# Patient Record
Sex: Female | Born: 1955 | Race: White | Hispanic: No | Smoking: Former smoker
Health system: Southern US, Community
[De-identification: ages and names within clinical notes are randomized; demographics above are authoritative.]

## PROBLEM LIST (undated history)

## (undated) DIAGNOSIS — J449 Chronic obstructive pulmonary disease, unspecified: Secondary | ICD-10-CM

## (undated) DIAGNOSIS — K219 Gastro-esophageal reflux disease without esophagitis: Secondary | ICD-10-CM

## (undated) DIAGNOSIS — R1084 Generalized abdominal pain: Secondary | ICD-10-CM

## (undated) DIAGNOSIS — R1310 Dysphagia, unspecified: Secondary | ICD-10-CM

## (undated) DIAGNOSIS — M1990 Unspecified osteoarthritis, unspecified site: Secondary | ICD-10-CM

## (undated) DIAGNOSIS — R634 Abnormal weight loss: Secondary | ICD-10-CM

## (undated) DIAGNOSIS — G510 Bell's palsy: Secondary | ICD-10-CM

## (undated) DIAGNOSIS — R197 Diarrhea, unspecified: Secondary | ICD-10-CM

## (undated) DIAGNOSIS — I10 Essential (primary) hypertension: Secondary | ICD-10-CM

## (undated) DIAGNOSIS — B019 Varicella without complication: Secondary | ICD-10-CM

## (undated) HISTORY — PX: DIAGNOSTIC LAPAROSCOPY: SUR761

## (undated) HISTORY — PX: TEMPOROMANDIBULAR JOINT SURGERY: SHX35

## (undated) HISTORY — DX: Chronic obstructive pulmonary disease, unspecified: J44.9

---

## 2008-11-21 ENCOUNTER — Emergency Department: Payer: Self-pay | Admitting: Emergency Medicine

## 2012-04-09 ENCOUNTER — Emergency Department: Payer: Self-pay | Admitting: Emergency Medicine

## 2012-04-09 LAB — COMPREHENSIVE METABOLIC PANEL
Albumin: 4.9 g/dL (ref 3.4–5.0)
Alkaline Phosphatase: 82 U/L (ref 50–136)
Anion Gap: 9 (ref 7–16)
BUN: 7 mg/dL (ref 7–18)
Bilirubin,Total: 0.4 mg/dL (ref 0.2–1.0)
Calcium, Total: 9.5 mg/dL (ref 8.5–10.1)
Chloride: 103 mmol/L (ref 98–107)
Co2: 26 mmol/L (ref 21–32)
Creatinine: 0.65 mg/dL (ref 0.60–1.30)
EGFR (African American): >60
EGFR (Non-African Amer.): >60
Glucose: 132 mg/dL — ABNORMAL HIGH (ref 65–99)
Osmolality: 276 (ref 275–301)
Potassium: 3.7 mmol/L (ref 3.5–5.1)
SGOT(AST): 18 U/L (ref 15–37)
SGPT (ALT): 17 U/L
Sodium: 138 mmol/L (ref 136–145)
Total Protein: 8.2 g/dL (ref 6.4–8.2)

## 2012-04-09 LAB — URINALYSIS, COMPLETE
Bacteria: NONE SEEN
Bilirubin,UR: NEGATIVE
Blood: NEGATIVE
Glucose,UR: NEGATIVE mg/dL (ref 0–75)
Ketone: NEGATIVE
Leukocyte Esterase: NEGATIVE
Nitrite: NEGATIVE
Ph: 7 (ref 4.5–8.0)
Protein: NEGATIVE
RBC,UR: <1 /HPF (ref 0–5)
Specific Gravity: 1.019 (ref 1.003–1.030)
Squamous Epithelial: <1
WBC UR: <1 /HPF (ref 0–5)

## 2012-04-09 LAB — CBC
HCT: 44.9 % (ref 35.0–47.0)
HGB: 15.2 g/dL (ref 12.0–16.0)
MCH: 29.9 pg (ref 26.0–34.0)
MCHC: 33.8 g/dL (ref 32.0–36.0)
MCV: 88 fL (ref 80–100)
Platelet: 245 10*3/uL (ref 150–440)
RBC: 5.08 10*6/uL (ref 3.80–5.20)
RDW: 13.7 % (ref 11.5–14.5)
WBC: 11.5 10*3/uL — ABNORMAL HIGH (ref 3.6–11.0)

## 2012-04-09 LAB — TROPONIN I
Troponin-I: 0.03 ng/mL
Troponin-I: 0.02 ng/mL

## 2012-04-09 LAB — TSH: Thyroid Stimulating Horm: 0.665 u[IU]/mL

## 2012-05-05 ENCOUNTER — Emergency Department: Payer: Self-pay | Admitting: Unknown Physician Specialty

## 2012-07-05 ENCOUNTER — Ambulatory Visit: Payer: Self-pay

## 2012-11-29 ENCOUNTER — Ambulatory Visit: Payer: Self-pay | Admitting: Orthopedic Surgery

## 2015-01-09 ENCOUNTER — Emergency Department: Payer: Self-pay | Admitting: Emergency Medicine

## 2015-01-10 LAB — COMPREHENSIVE METABOLIC PANEL
Albumin: 3.6 g/dL (ref 3.4–5.0)
Alkaline Phosphatase: 79 U/L (ref 46–116)
Anion Gap: 6 — ABNORMAL LOW (ref 7–16)
BUN: 8 mg/dL (ref 7–18)
Bilirubin,Total: 0.3 mg/dL (ref 0.2–1.0)
Calcium, Total: 8.8 mg/dL (ref 8.5–10.1)
Chloride: 108 mmol/L — ABNORMAL HIGH (ref 98–107)
Co2: 28 mmol/L (ref 21–32)
Creatinine: 0.78 mg/dL (ref 0.60–1.30)
EGFR (African American): >60
EGFR (Non-African Amer.): >60
Glucose: 99 mg/dL (ref 65–99)
Osmolality: 281 (ref 275–301)
Potassium: 3.9 mmol/L (ref 3.5–5.1)
SGOT(AST): 22 U/L (ref 15–37)
SGPT (ALT): 19 U/L (ref 14–63)
Sodium: 142 mmol/L (ref 136–145)
Total Protein: 6.5 g/dL (ref 6.4–8.2)

## 2015-01-10 LAB — URINALYSIS, COMPLETE
Bilirubin,UR: NEGATIVE
Blood: NEGATIVE
Glucose,UR: NEGATIVE mg/dL (ref 0–75)
Ketone: NEGATIVE
Leukocyte Esterase: NEGATIVE
Nitrite: NEGATIVE
Ph: 5 (ref 4.5–8.0)
Protein: NEGATIVE
RBC,UR: 2 /HPF (ref 0–5)
Specific Gravity: 1.017 (ref 1.003–1.030)
Squamous Epithelial: 3
WBC UR: 1 /HPF (ref 0–5)

## 2015-01-10 LAB — CBC WITH DIFFERENTIAL/PLATELET
Basophil #: 0 10*3/uL (ref 0.0–0.1)
Basophil %: 0.3 %
Eosinophil #: 0.2 10*3/uL (ref 0.0–0.7)
Eosinophil %: 2.2 %
HCT: 38.8 % (ref 35.0–47.0)
HGB: 13.1 g/dL (ref 12.0–16.0)
Lymphocyte #: 1.4 10*3/uL (ref 1.0–3.6)
Lymphocyte %: 13.6 %
MCH: 29.2 pg (ref 26.0–34.0)
MCHC: 33.6 g/dL (ref 32.0–36.0)
MCV: 87 fL (ref 80–100)
Monocyte #: 0.7 x10 3/mm (ref 0.2–0.9)
Monocyte %: 7.1 %
Neutrophil #: 8 10*3/uL — ABNORMAL HIGH (ref 1.4–6.5)
Neutrophil %: 76.8 %
Platelet: 220 10*3/uL (ref 150–440)
RBC: 4.48 10*6/uL (ref 3.80–5.20)
RDW: 12.9 % (ref 11.5–14.5)
WBC: 10.4 10*3/uL (ref 3.6–11.0)

## 2015-01-10 LAB — LIPASE, BLOOD: Lipase: 43 U/L — ABNORMAL LOW (ref 73–393)

## 2015-01-10 LAB — TROPONIN I: Troponin-I: <0.02 ng/mL

## 2015-03-20 ENCOUNTER — Ambulatory Visit: Admit: 2015-03-20 | Disposition: A | Discharge status: Home / Self Care | Payer: Self-pay | Admitting: Family Medicine

## 2015-06-26 DIAGNOSIS — R634 Abnormal weight loss: Secondary | ICD-10-CM | POA: Insufficient documentation

## 2015-06-26 DIAGNOSIS — R1084 Generalized abdominal pain: Secondary | ICD-10-CM | POA: Insufficient documentation

## 2015-06-26 DIAGNOSIS — R12 Heartburn: Secondary | ICD-10-CM | POA: Insufficient documentation

## 2015-06-26 DIAGNOSIS — R1310 Dysphagia, unspecified: Secondary | ICD-10-CM | POA: Insufficient documentation

## 2015-07-19 ENCOUNTER — Other Ambulatory Visit
Admission: RE | Admit: 2015-07-19 | Discharge: 2015-07-19 | Disposition: A | Discharge status: Home / Self Care | Payer: PRIVATE HEALTH INSURANCE | Source: Other Acute Inpatient Hospital | Attending: Nurse Practitioner | Admitting: Nurse Practitioner

## 2015-07-19 DIAGNOSIS — R197 Diarrhea, unspecified: Secondary | ICD-10-CM | POA: Diagnosis present

## 2015-07-19 LAB — C DIFFICILE QUICK SCREEN W PCR REFLEX
C Diff antigen: POSITIVE — AB
C Diff interpretation: POSITIVE
C Diff toxin: POSITIVE — AB

## 2015-07-21 LAB — STOOL CULTURE

## 2015-07-26 LAB — O&P RESULT

## 2015-07-26 LAB — GIARDIA, EIA; OVA/PARASITE: Giardia Ag, Stl: NEGATIVE

## 2015-09-13 ENCOUNTER — Encounter: Payer: Self-pay | Admitting: *Deleted

## 2015-09-14 ENCOUNTER — Encounter: Payer: Self-pay | Admitting: Anesthesiology

## 2015-09-14 ENCOUNTER — Encounter
Admission: RE | Disposition: A | Discharge status: Home / Self Care | Payer: Self-pay | Source: Ambulatory Visit | Attending: Unknown Physician Specialty

## 2015-09-14 ENCOUNTER — Ambulatory Visit
Admission: RE | Admit: 2015-09-14 | Discharge: 2015-09-14 | Disposition: A | Discharge status: Home / Self Care | Payer: PRIVATE HEALTH INSURANCE | Source: Ambulatory Visit | Attending: Unknown Physician Specialty | Admitting: Unknown Physician Specialty

## 2015-09-14 ENCOUNTER — Ambulatory Visit: Payer: PRIVATE HEALTH INSURANCE | Admitting: Anesthesiology

## 2015-09-14 DIAGNOSIS — R1084 Generalized abdominal pain: Secondary | ICD-10-CM | POA: Insufficient documentation

## 2015-09-14 DIAGNOSIS — R197 Diarrhea, unspecified: Secondary | ICD-10-CM | POA: Diagnosis not present

## 2015-09-14 DIAGNOSIS — K640 First degree hemorrhoids: Secondary | ICD-10-CM | POA: Diagnosis not present

## 2015-09-14 DIAGNOSIS — Z79899 Other long term (current) drug therapy: Secondary | ICD-10-CM | POA: Insufficient documentation

## 2015-09-14 DIAGNOSIS — R1310 Dysphagia, unspecified: Secondary | ICD-10-CM | POA: Diagnosis present

## 2015-09-14 DIAGNOSIS — I10 Essential (primary) hypertension: Secondary | ICD-10-CM | POA: Diagnosis not present

## 2015-09-14 DIAGNOSIS — K219 Gastro-esophageal reflux disease without esophagitis: Secondary | ICD-10-CM | POA: Insufficient documentation

## 2015-09-14 DIAGNOSIS — K2970 Gastritis, unspecified, without bleeding: Secondary | ICD-10-CM | POA: Diagnosis not present

## 2015-09-14 DIAGNOSIS — M1990 Unspecified osteoarthritis, unspecified site: Secondary | ICD-10-CM | POA: Diagnosis not present

## 2015-09-14 DIAGNOSIS — K621 Rectal polyp: Secondary | ICD-10-CM | POA: Insufficient documentation

## 2015-09-14 DIAGNOSIS — R12 Heartburn: Secondary | ICD-10-CM | POA: Diagnosis not present

## 2015-09-14 HISTORY — DX: Dysphagia, unspecified: R13.10

## 2015-09-14 HISTORY — DX: Diarrhea, unspecified: R19.7

## 2015-09-14 HISTORY — DX: Generalized abdominal pain: R10.84

## 2015-09-14 HISTORY — DX: Abnormal weight loss: R63.4

## 2015-09-14 HISTORY — DX: Bell's palsy: G51.0

## 2015-09-14 HISTORY — DX: Unspecified osteoarthritis, unspecified site: M19.90

## 2015-09-14 HISTORY — DX: Varicella without complication: B01.9

## 2015-09-14 HISTORY — DX: Essential (primary) hypertension: I10

## 2015-09-14 HISTORY — DX: Gastro-esophageal reflux disease without esophagitis: K21.9

## 2015-09-14 HISTORY — PX: ESOPHAGOGASTRODUODENOSCOPY (EGD) WITH PROPOFOL: SHX5813

## 2015-09-14 HISTORY — PX: COLONOSCOPY WITH PROPOFOL: SHX5780

## 2015-09-14 HISTORY — PX: SAVORY DILATION: SHX5439

## 2015-09-14 SURGERY — ESOPHAGOGASTRODUODENOSCOPY (EGD) WITH PROPOFOL
Anesthesia: General

## 2015-09-14 MED ORDER — PHENYLEPHRINE HCL 10 MG/ML IJ SOLN
INTRAMUSCULAR | Status: DC | PRN
  Administered 2015-09-14 (×3): 100 ug via INTRAVENOUS

## 2015-09-14 MED ORDER — SODIUM CHLORIDE 0.9 % IV SOLN: INTRAVENOUS | Status: DC

## 2015-09-14 MED ORDER — IPRATROPIUM-ALBUTEROL 0.5-2.5 (3) MG/3ML IN SOLN
3.0000 mL | Freq: Once | RESPIRATORY_TRACT | Status: AC
  Administered 2015-09-14: 3 mL via RESPIRATORY_TRACT

## 2015-09-14 MED ORDER — SODIUM CHLORIDE 0.9 % IV SOLN
INTRAVENOUS | Status: DC
  Administered 2015-09-14: 1000 mL via INTRAVENOUS

## 2015-09-14 MED ORDER — EPHEDRINE SULFATE 50 MG/ML IJ SOLN
INTRAMUSCULAR | Status: DC | PRN
  Administered 2015-09-14 (×2): 10 mg via INTRAVENOUS

## 2015-09-14 MED ORDER — PROPOFOL 500 MG/50ML IV EMUL
INTRAVENOUS | Status: DC | PRN
  Administered 2015-09-14: 150 ug/kg/min via INTRAVENOUS

## 2015-09-14 MED ORDER — LIDOCAINE HCL (CARDIAC) 20 MG/ML IV SOLN
INTRAVENOUS | Status: DC | PRN
  Administered 2015-09-14: 80 mg via INTRAVENOUS

## 2015-09-14 MED ORDER — GLYCOPYRROLATE 0.2 MG/ML IJ SOLN
INTRAMUSCULAR | Status: DC | PRN
  Administered 2015-09-14: 0.2 mg via INTRAVENOUS

## 2015-09-14 MED ORDER — IPRATROPIUM-ALBUTEROL 0.5-2.5 (3) MG/3ML IN SOLN
RESPIRATORY_TRACT | Status: AC
  Administered 2015-09-14: 3 mL
  Filled 2015-09-14: qty 3

## 2015-09-14 MED ORDER — MIDAZOLAM HCL 2 MG/2ML IJ SOLN
INTRAMUSCULAR | Status: DC | PRN
  Administered 2015-09-14: 2 mg via INTRAVENOUS

## 2015-09-14 NOTE — H&P (Signed)
   Primary Care Physician:  No primary care provider on file. Primary Gastroenterologist:  Dr. Vira Agar  Pre-Procedure History & Physical: HPI:  Emily Figueroa is a 59 y.o. female is here for an endoscopy and colonoscopy.   Past Medical History  Diagnosis Date  . Hypertension   . Arthritis   . Bell's palsy   . Chicken pox   . Diarrhea   . Dysphagia   . GERD (gastroesophageal reflux disease)   . Abdominal pain, generalized   . Weight loss     Past Surgical History  Procedure Laterality Date  . Diagnostic laparoscopy    . Temporomandibular joint surgery      Prior to Admission medications   Medication Sig Start Date End Date Taking? Authorizing Provider  omeprazole (PRILOSEC) 20 MG capsule Take 20 mg by mouth daily.   Yes Historical Provider, MD    Allergies as of 06/26/2015  . (Not on File)    History reviewed. No pertinent family history.  Social History   Social History  . Marital Status: Married    Spouse Name: N/A  . Number of Children: N/A  . Years of Education: N/A   Occupational History  . Not on file.   Social History Main Topics  . Smoking status: Not on file  . Smokeless tobacco: Not on file  . Alcohol Use: Not on file  . Drug Use: Not on file  . Sexual Activity: Not on file   Other Topics Concern  . Not on file   Social History Narrative    Review of Systems: See HPI, otherwise negative ROS  Physical Exam: BP 109/76 mmHg  Pulse 110  Temp(Src) 96.9 F (36.1 C) (Tympanic)  Resp 17  Ht 5' (1.524 m)  Wt 56.246 kg (124 lb)  BMI 24.22 kg/m2  SpO2 100% General:   Alert,  pleasant and cooperative in NAD Head:  Normocephalic and atraumatic. Neck:  Supple; no masses or thyromegaly. Lungs:  Clear throughout to auscultation.    Heart:  Regular rate and rhythm. Abdomen:  Soft, nontender and nondistended. Normal bowel sounds, without guarding, and without rebound.   Neurologic:  Alert and  oriented x4;  grossly normal  neurologically.  Impression/Plan: Emily Figueroa is here for an endoscopy and colonoscopy to be performed for dysphagia, diarrhea heartburn  Risks, benefits, limitations, and alternatives regarding  endoscopy and colonoscopy have been reviewed with the patient.  Questions have been answered.  All parties agreeable.   Gaylyn Cheers, MD  09/14/2015, 9:00 AM

## 2015-09-14 NOTE — Op Note (Signed)
[facility] Gastroenterology Patient Name: Emily Figueroa Procedure Date: 09/14/2015 9:16 AM MRN: [id number] Account #: [id number] Date of Birth: 06/05/1956 Admit Type: Outpatient Age: 59 Room: [facility] ENDO ROOM 1 Gender: Female Note Status: Finalized Procedure:         Colonoscopy Indications:       Diarrhea Providers:         Manya Silvas, MD Medicines:         Propofol per Anesthesia Complications:     No immediate complications. Procedure:         Pre-Anesthesia Assessment:                    - After reviewing the risks and benefits, the patient was                     deemed in satisfactory condition to undergo the procedure.                    After obtaining informed consent, the colonoscope was                     passed under direct vision. Throughout the procedure, the                     patient's blood pressure, pulse, and oxygen saturations                     were monitored continuously. The Olympus PCF-H180AL                     colonoscope ( S#: [id number] ) was introduced through the                     anus and advanced to the the cecum, identified by                     appendiceal orifice and ileocecal valve. The colonoscopy                     was performed without difficulty. The patient tolerated                     the procedure well. The quality of the bowel preparation                     was excellent. Findings:      Four sessile polyps were found in the rectum. The polyps were diminutive       in size. These polyps were removed with a jumbo cold forceps. Resection       and retrieval were complete.      Internal hemorrhoids were found during endoscopy. The hemorrhoids were       small and Grade I (internal hemorrhoids that do not prolapse).      The exam was otherwise without abnormality. Impression:        - Four diminutive polyps in the rectum. Resected and                     retrieved.                    - Internal hemorrhoids.                 - The examination was otherwise normal. Recommendation:    -  Await pathology results. Manya Silvas, MD 09/14/2015 9:34:02 AM This report has been signed electronically. Number of Addenda: 0 Note Initiated On: 09/14/2015 9:16 AM Scope Withdrawal Time: 0 hours 5 minutes 52 seconds  Total Procedure Duration: 0 hours 10 minutes 21 seconds       [facility]

## 2015-09-14 NOTE — Op Note (Signed)
[facility] Gastroenterology Patient Name: Emily Figueroa Procedure Date: 09/14/2015 9:04 AM MRN: [id number] Account #: [id number] Date of Birth: 28-Mar-1956 Admit Type: Outpatient Age: 60 Room: [facility] ENDO ROOM 1 Gender: Female Note Status: Finalized Procedure:         Upper GI endoscopy Indications:       Dysphagia Providers:         Manya Silvas, MD Referring MD:      No Local Md, MD (Referring MD) Medicines:         Propofol per Anesthesia Complications:     No immediate complications. Procedure:         Pre-Anesthesia Assessment:                    - After reviewing the risks and benefits, the patient was                     deemed in satisfactory condition to undergo the procedure.                    After obtaining informed consent, the endoscope was passed                     under direct vision. Throughout the procedure, the                     patient's blood pressure, pulse, and oxygen saturations                     were monitored continuously. The Endoscope was introduced                     through the mouth, and advanced to the second part of                     duodenum. The upper GI endoscopy was accomplished without                     difficulty. The patient tolerated the procedure well. Findings:      The examined esophagus was normal. A guidewire was placed and the scope       was withdrawn. Dilation was performed with a Savary dilator with no       resistance at 17 mm. GEJ 39cm      Scattered and patchy moderate inflammation characterized by erythema and       granularity was found in the gastric body. Biopsies were taken with a       cold forceps for histology. Biopsies were taken with a cold forceps for       Helicobacter pylori testing.      The examined duodenum was normal. Impression:        - Normal esophagus. Dilated.                    - Gastritis. Biopsied.                    - Normal examined duodenum. Recommendation:    - Await  pathology results. Manya Silvas, MD 09/14/2015 9:16:31 AM This report has been signed electronically. Number of Addenda: 0 Note Initiated On: 09/14/2015 9:04 AM      [facility]

## 2015-09-14 NOTE — Anesthesia Postprocedure Evaluation (Signed)
  Anesthesia Post-op Note  Patient: Emily Figueroa  Procedure(s) Performed: Procedure(s): ESOPHAGOGASTRODUODENOSCOPY (EGD) WITH PROPOFOL (N/A) COLONOSCOPY WITH PROPOFOL (N/A) SAVORY DILATION (N/A)  Anesthesia type:General  Patient location: PACU  Post pain: Pain level controlled  Post assessment: Post-op Vital signs reviewed, Patient's Cardiovascular Status Stable, Respiratory Function Stable, Patent Airway and No signs of Nausea or vomiting  Post vital signs: Reviewed and stable  Last Vitals:  Filed Vitals:   09/14/15 1005  BP: 109/76  Pulse: 100  Temp:   Resp: 15    Level of consciousness: awake, alert  and patient cooperative  Complications: No apparent anesthesia complications

## 2015-09-14 NOTE — Transfer of Care (Signed)
Immediate Anesthesia Transfer of Care Note  Patient: Mackie Goon Tennyson  Procedure(s) Performed: Procedure(s): ESOPHAGOGASTRODUODENOSCOPY (EGD) WITH PROPOFOL (N/A) COLONOSCOPY WITH PROPOFOL (N/A) SAVORY DILATION (N/A)  Patient Location: PACU and Endoscopy Unit  Anesthesia Type:General  Level of Consciousness: sedated  Airway & Oxygen Therapy: Patient Spontanous Breathing and Patient connected to nasal cannula oxygen  Post-op Assessment: Report given to RN and Post -op Vital signs reviewed and stable  Post vital signs: Reviewed and stable  Last Vitals: 09:37 100 hr 100 % 18r 96/59 97.2 Filed Vitals:   09/14/15 0935  BP:   Pulse:   Temp: 36.2 C  Resp:     Complications: No apparent anesthesia complications

## 2015-09-14 NOTE — Anesthesia Preprocedure Evaluation (Addendum)
Anesthesia Evaluation  Patient identified by MRN, date of birth, ID band Patient awake    Reviewed: Allergy & Precautions, H&P , NPO status , Patient's Chart, lab work & pertinent test results  Airway Mallampati: II  TM Distance: >3 FB Neck ROM: limited    Dental  (+) Upper Dentures, Poor Dentition, Missing   Pulmonary neg shortness of breath, COPD,    Pulmonary exam normal breath sounds clear to auscultation       Cardiovascular Exercise Tolerance: Good hypertension, (-) Past MI Normal cardiovascular exam Rhythm:regular Rate:Normal     Neuro/Psych negative neurological ROS  negative psych ROS   GI/Hepatic Neg liver ROS, GERD  Controlled,  Endo/Other  negative endocrine ROS  Renal/GU negative Renal ROS  negative genitourinary   Musculoskeletal  (+) Arthritis ,   Abdominal   Peds  Hematology negative hematology ROS (+)   Anesthesia Other Findings Past Medical History:   Hypertension                                                 Arthritis                                                    Bell's palsy                                                 Chicken pox                                                  Diarrhea                                                     Dysphagia                                                    GERD (gastroesophageal reflux disease)                       Abdominal pain, generalized                                  Weight loss                                                  Reproductive/Obstetrics negative OB ROS  Anesthesia Physical Anesthesia Plan  ASA: III  Anesthesia Plan: General   Post-op Pain Management:    Induction:   Airway Management Planned:   Additional Equipment:   Intra-op Plan:   Post-operative Plan:   Informed Consent: I have reviewed the patients History and Physical, chart, labs and  discussed the procedure including the risks, benefits and alternatives for the proposed anesthesia with the patient or authorized representative who has indicated his/her understanding and acceptance.   Dental Advisory Given  Plan Discussed with: Anesthesiologist, CRNA and Surgeon  Anesthesia Plan Comments:         Anesthesia Quick Evaluation

## 2015-09-17 LAB — SURGICAL PATHOLOGY

## 2015-10-05 ENCOUNTER — Encounter: Payer: Self-pay | Admitting: Unknown Physician Specialty

## 2017-09-17 ENCOUNTER — Ambulatory Visit
Admission: RE | Admit: 2017-09-17 | Discharge: 2017-09-17 | Disposition: A | Discharge status: Home / Self Care | Payer: BLUE CROSS/BLUE SHIELD | Source: Ambulatory Visit | Attending: Family | Admitting: Family

## 2017-09-17 ENCOUNTER — Other Ambulatory Visit: Payer: Self-pay | Admitting: Family

## 2017-09-17 DIAGNOSIS — R0600 Dyspnea, unspecified: Secondary | ICD-10-CM | POA: Diagnosis not present

## 2017-09-17 DIAGNOSIS — F17200 Nicotine dependence, unspecified, uncomplicated: Secondary | ICD-10-CM | POA: Insufficient documentation

## 2017-09-17 DIAGNOSIS — R5383 Other fatigue: Secondary | ICD-10-CM | POA: Insufficient documentation

## 2017-11-10 ENCOUNTER — Telehealth: Payer: Self-pay

## 2017-11-10 NOTE — Telephone Encounter (Signed)
Pt was referred for pulmonology, not primary. Gave pt phone # to call to get an appointment.

## 2017-11-10 NOTE — Telephone Encounter (Signed)
Copied from [place]. Topic: Referral - Question >> Nov 10, 2017 12:53 PM Corie Chiquito, [place] wrote: Reason for CRM: Patient called and stated that she has a referral to this office from a Garen Grams. Could someone please give her a call back about this at [phone]

## 2017-11-23 ENCOUNTER — Institutional Professional Consult (permissible substitution): Payer: PRIVATE HEALTH INSURANCE | Admitting: Internal Medicine

## 2017-11-25 ENCOUNTER — Telehealth: Payer: Self-pay | Admitting: Internal Medicine

## 2017-11-25 NOTE — Telephone Encounter (Signed)
Second Attempt  lmov for patient to call back need to reschedule appointment from 11/23/17  Due to weather we were closed.  She needs appointment rescheduled with Dr Ashby Dawes  Will await patient call.

## 2017-12-02 ENCOUNTER — Encounter: Payer: Self-pay | Admitting: Pulmonary Disease

## 2017-12-02 NOTE — Telephone Encounter (Signed)
3rd attempt   lmov to r/s appt missed due to weather   Mailed letter

## 2017-12-02 NOTE — Telephone Encounter (Signed)
Routed letter to pcp

## 2017-12-31 NOTE — Progress Notes (Signed)
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 -Chest x-ray consistent with severe emphysema. -Questionable allergic reaction to Symbicort in the past, she is given a coupon for Spiriva Respimat, and asked to start this 2 puffs once daily.  We also discussed the importance of smoking cessation.  We will send for PFT.  Excessive daytime sleepiness. -Daytime sleepiness and snoring at night, symptoms and signs of obstructive sleep apnea. - We will send for sleep study.  Tobacco abuse. -Spent greater than 3 minutes in discussion.  Atypical chest pain. - Left-sided with sometimes goes to her left arm.  This occurs at various times including at rest, no relation to activity.  It was explained to her that this is unlikely to be related to COPD - I asked that she let her primary care doctor know about the symptoms that she has not in the past.  She was also instructed that if she develops chest pain again she should call 911 or go to the emergency room.   Date: 01/01/2018  MRN# [id number] Emily Figueroa August 18, 1956    Emily Figueroa is a 62 y.o. old female seen in consultation for chief complaint of:    Chief Complaint  Patient presents with  . Advice Only    Referred by L.Sharpe for COPD eval:  . Shortness of Breath    with and without exertion  . Chest Pain    feels like something sitting on her chest  . Cough    mostly non productive  . Wheezing    slightly.    HPI:  She has dyspnea, she has had recurrent episodes of pneumonia in the past. She smokes occasionally, about once per week.  She has been started on proair and symbicort. Her speech is pressured today, but she does not have conversational dyspnea.  She stopped taking it after she broke out in hives. She has been on it for less than a month, this coincided when she ran out of singulair, which she is still on.  She remains dyspneic, she has proair but uses  it once or twice per month.  She snores loudly.  She has a history of TMJ.  She has trouble making it around a supermarket, this has been happening for about 2 years.   Desat walk 01/01/18; on RA at rest her sat is 98% and HR 95. She walked 360 feet at brisk pace, moderate dyspena, Sat 95% and HR 101.   Imaging personally reviewed, chest x-ray 09/17/17, hyperinflation consistent with emphysema.  CT chest 04/09/12 moderate to severe changes of apical emphysema   PMHX:   Past Medical History:  Diagnosis Date  . Abdominal pain, generalized   . Arthritis   . Bell's palsy   . Chicken pox   . Diarrhea   . Dysphagia   . GERD (gastroesophageal reflux disease)   . Hypertension   . Weight loss    Surgical Hx:  Past Surgical History:  Procedure Laterality Date  . COLONOSCOPY WITH PROPOFOL N/A 09/14/2015   Procedure: COLONOSCOPY WITH PROPOFOL;  Surgeon: Manya Silvas, MD;  Location: [facility] ENDOSCOPY;  Service: Endoscopy;  Laterality: N/A;  . DIAGNOSTIC LAPAROSCOPY    . ESOPHAGOGASTRODUODENOSCOPY (EGD) WITH PROPOFOL N/A 09/14/2015   Procedure: ESOPHAGOGASTRODUODENOSCOPY (EGD) WITH PROPOFOL;  Surgeon: Manya Silvas, MD;  [facility] ENDOSCOPY;  Service: Endoscopy;  Laterality: N/A;  . SAVORY DILATION N/A 09/14/2015   Procedure: SAVORY DILATION;  Surgeon:  Manya Silvas, MD;  Location: [facility] ENDOSCOPY;  Service: Endoscopy;  Laterality: N/A;  . TEMPOROMANDIBULAR JOINT SURGERY     Family Hx:  Family History  Problem Relation Age of Onset  . Leukemia Mother   . Heart Problems Mother    Social Hx:   Social History   Tobacco Use  . Smoking status: Current Some Day Smoker    Years: 45.00    Types: Cigarettes  . Smokeless tobacco: Never Used  Substance Use Topics  . Alcohol use: No    Frequency: Never  . Drug use: No   Medication:    Current Outpatient Medications:  .  albuterol (PROVENTIL HFA;VENTOLIN HFA) 108 (90 Base) MCG/ACT inhaler, Inhale 2 puffs into the lungs every 6  (six) hours as needed for wheezing or shortness of breath., Disp: , Rfl:  .  LORazepam (ATIVAN) 1 MG tablet, Take 1 mg by mouth daily as needed., Disp: , Rfl:  .  montelukast (SINGULAIR) 10 MG tablet, Take 10 mg by mouth at bedtime., Disp: , Rfl:  .  omeprazole (PRILOSEC) 20 MG capsule, Take 20 mg by mouth daily., Disp: , Rfl:    Allergies:  Symbicort [budesonide-formoterol fumarate]  Review of Systems: Gen:  Denies  fever, sweats, chills HEENT: Denies blurred vision, double vision. bleeds, sore throat Cvc:  No dizziness, chest pain. Resp:   Denies cough or sputum production, shortness of breath Gi: Denies swallowing difficulty, stomach pain. Gu:  Denies bladder incontinence, burning urine Ext:   No Joint pain, stiffness. Skin: No skin rash,  hives  Endoc:  No polyuria, polydipsia. Psych: No depression, insomnia. Other:  All other systems were reviewed with the patient and were negative other that what is mentioned in the HPI.   Physical Examination:   VS: BP (!) 144/98 (BP Location: Left Arm, Cuff Size: Normal)   Pulse (!) 107   Resp 16   Ht 5' (1.524 m)   Wt 133 lb (60.3 kg)   SpO2 100%   BMI 25.97 kg/m   General Appearance: No distress  Neuro:without focal findings,  speech normal,  HEENT: PERRLA, EOM intact.  [place] 3.  Pulmonary: normal breath sounds, No wheezing. Decreased air entry bilat.  CardiovascularNormal S1,S2.  No m/r/g.   Abdomen: Benign, Soft, non-tender. Renal:  No costovertebral tenderness  GU:  No performed at this time. Endoc: No evident thyromegaly, no signs of acromegaly. Skin:   warm, no rashes, no ecchymosis  Extremities: normal, no cyanosis, clubbing.  Other findings:    LABORATORY PANEL:   CBC No results for input(s): WBC, HGB, HCT, PLT in the last 168 hours. ------------------------------------------------------------------------------------------------------------------  Chemistries  No results for input(s): NA, K, CL, CO2,  GLUCOSE, BUN, CREATININE, CALCIUM, MG, AST, ALT, ALKPHOS, BILITOT in the last 168 hours.  Invalid input(s): GFRCGP ------------------------------------------------------------------------------------------------------------------  Cardiac Enzymes No results for input(s): TROPONINI in the last 168 hours. ------------------------------------------------------------  RADIOLOGY:  No results found.     Thank  you for the consultation and for allowing [facility] to assist in the care of your patient. Our recommendations are noted above.  Please contact us if we can be of further service.   Marda Stalker, MD.  Board Certified in Internal Medicine, Pulmonary Medicine, [place], and Sleep Medicine.  [facility] Office Number: [phone]  Patricia Pesa, M.D.  Merton Border, M.D  01/01/2018

## 2018-01-01 ENCOUNTER — Encounter: Payer: Self-pay | Admitting: Internal Medicine

## 2018-01-01 ENCOUNTER — Ambulatory Visit: Payer: BLUE CROSS/BLUE SHIELD | Admitting: Internal Medicine

## 2018-01-01 VITALS — BP 144/98 | HR 107 | Resp 16 | Ht 60.0 in | Wt 133.0 lb

## 2018-01-01 DIAGNOSIS — F17210 Nicotine dependence, cigarettes, uncomplicated: Secondary | ICD-10-CM

## 2018-01-01 DIAGNOSIS — G4719 Other hypersomnia: Secondary | ICD-10-CM | POA: Diagnosis not present

## 2018-01-01 DIAGNOSIS — J449 Chronic obstructive pulmonary disease, unspecified: Secondary | ICD-10-CM

## 2018-01-01 DIAGNOSIS — M6530 Trigger finger, unspecified finger: Secondary | ICD-10-CM | POA: Insufficient documentation

## 2018-01-01 DIAGNOSIS — Z720 Tobacco use: Secondary | ICD-10-CM

## 2018-01-01 MED ORDER — TIOTROPIUM BROMIDE MONOHYDRATE 2.5 MCG/ACT IN AERS: 2.0000 [IU] | INHALATION_SPRAY | Freq: Every day | RESPIRATORY_TRACT | 5 refills | Status: DC

## 2018-01-01 NOTE — Patient Instructions (Addendum)
--  Quitting smoking is the most important thing that you can do for your health.  --Quitting smoking will have greater affect on your health than any medicine that we can give you.   IF you are having chest pain you should go to the ER or call 911.   Start spiriva respimat 2 puffs once daily.   Will send for sleep study.

## 2018-01-06 ENCOUNTER — Telehealth: Payer: Self-pay | Admitting: Internal Medicine

## 2018-01-06 NOTE — Telephone Encounter (Signed)
Patient says she has contacted [facility] and they have not received the prescription for [facility]  Please submit

## 2018-01-06 NOTE — Telephone Encounter (Signed)
Called and left rx verbal on prescriber line. Left call back # if any questions. It appears that rx was submitted to [facility] on 01/01/18. Not sure why they didn't receive.

## 2018-01-07 NOTE — Telephone Encounter (Signed)
Per [facility] the spiriva is too expensive and to see if there is an alternative like the handi inhaler instead

## 2018-01-08 MED ORDER — TIOTROPIUM BROMIDE MONOHYDRATE 18 MCG IN CAPS: 18.0000 ug | ORAL_CAPSULE | Freq: Every day | RESPIRATORY_TRACT | 5 refills | Status: DC

## 2018-01-08 NOTE — Telephone Encounter (Signed)
Did she try using the coupon we gave here for spiriva respimat? If that does not work, then can try spiriva handihaler one puff daily.

## 2018-01-08 NOTE — Telephone Encounter (Signed)
Spiriva handihaler sent.

## 2018-01-08 NOTE — Addendum Note (Signed)
Addended by: Stephanie Coup on: 01/08/2018 08:56 AM   Modules accepted: Orders

## 2018-01-15 ENCOUNTER — Telehealth: Payer: Self-pay | Admitting: Internal Medicine

## 2018-01-15 NOTE — Telephone Encounter (Signed)
Pt states she is broken out into hives and her nose is runnig, she thinks this is an allergic reaction to the Spiriva. Please call to discuss

## 2018-01-18 NOTE — Telephone Encounter (Signed)
Stop for one week, and see if it goes away. Restart in one week, if symptoms return then stop and let us know.

## 2018-01-18 NOTE — Telephone Encounter (Signed)
Patient aware.

## 2018-02-03 ENCOUNTER — Encounter: Payer: Self-pay | Admitting: Internal Medicine

## 2018-02-03 DIAGNOSIS — G4733 Obstructive sleep apnea (adult) (pediatric): Secondary | ICD-10-CM

## 2018-02-03 DIAGNOSIS — G4719 Other hypersomnia: Secondary | ICD-10-CM

## 2018-02-03 DIAGNOSIS — Z720 Tobacco use: Secondary | ICD-10-CM

## 2018-02-03 DIAGNOSIS — J449 Chronic obstructive pulmonary disease, unspecified: Secondary | ICD-10-CM

## 2018-02-03 DIAGNOSIS — J4489 Other specified chronic obstructive pulmonary disease: Secondary | ICD-10-CM

## 2018-02-12 DIAGNOSIS — G4733 Obstructive sleep apnea (adult) (pediatric): Secondary | ICD-10-CM | POA: Diagnosis not present

## 2018-02-16 ENCOUNTER — Telehealth: Payer: Self-pay | Admitting: *Deleted

## 2018-02-16 DIAGNOSIS — G4733 Obstructive sleep apnea (adult) (pediatric): Secondary | ICD-10-CM

## 2018-02-16 NOTE — Telephone Encounter (Signed)
LMOM for pt to call back for sleep study. Pt has moderate OSA with an AHI of 18.

## 2018-02-17 NOTE — Telephone Encounter (Signed)
Pt informed of results. CPAP ordered. Nothing further needed.

## 2018-02-17 NOTE — Telephone Encounter (Signed)
LMOM for daughter and pt to give [place] a call back for results.

## 2018-02-17 NOTE — Telephone Encounter (Signed)
Spoke with daughter and she states the pt will call back this afternoon.

## 2018-02-17 NOTE — Telephone Encounter (Signed)
Patient daughter returning call  °

## 2018-03-03 ENCOUNTER — Telehealth: Payer: Self-pay | Admitting: Internal Medicine

## 2018-03-03 NOTE — Telephone Encounter (Signed)
Melissa with [facility] calling patient declines CPAP set up due to her financial responsibility  FYI

## 2018-04-19 ENCOUNTER — Ambulatory Visit: Payer: BLUE CROSS/BLUE SHIELD | Admitting: Internal Medicine

## 2018-04-19 ENCOUNTER — Encounter: Payer: Self-pay | Admitting: Internal Medicine

## 2018-04-19 VITALS — BP 142/82 | HR 110 | Ht 60.0 in | Wt 134.0 lb

## 2018-04-19 DIAGNOSIS — J449 Chronic obstructive pulmonary disease, unspecified: Secondary | ICD-10-CM | POA: Diagnosis not present

## 2018-04-19 DIAGNOSIS — J4489 Other specified chronic obstructive pulmonary disease: Secondary | ICD-10-CM

## 2018-04-19 DIAGNOSIS — G4733 Obstructive sleep apnea (adult) (pediatric): Secondary | ICD-10-CM

## 2018-04-19 NOTE — Progress Notes (Signed)
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 -Chest x-ray consistent with severe emphysema. -Doing well with spiriva, to continue. Had possible allergic reaction to symbicort in the past.   Obstructive sleep apnea.  -HST 02/03/18; AHI of 18.  --Found CPAP too expensive and was not willing to start.   Tobacco abuse. -She has quit smoking and is congratulated.    Date: 04/19/2018  MRN# [id number] Emily Figueroa 14-Jun-1956    Emily Figueroa is a 62 y.o. old female seen in consultation for chief complaint of:    Chief Complaint  Patient presents with  . Follow-up    COPD: SOB w/activity: cough at times: doing better:    HPI:  The patient is a 62 year old female with COPD.  Last visit it was noted that she had continued symptoms of dyspnea on exertion.  She was started on Spiriva, she developed a rash on her nose, she was asked to stop it and then restart it and see if the rash went away and came back.  She was also sent for sleep study, and recommended smoking cessation.  She is back on spiriva and notices that it helps her breathing, she stopped smoking and feels that her breathing is better. She went through a sleep study which was showed OSA. She has more energy during the day than she did before.   She has a history of TMJ.  She has trouble making it around a supermarket, this has been happening for about 2 years.   Desat walk 01/01/18; on RA at rest her sat is 98% and HR 95. She walked 360 feet at brisk pace, moderate dyspena, Sat 95% and HR 101.  Imaging personally reviewed, chest x-ray 09/17/17, hyperinflation consistent with emphysema.  CT chest 04/09/12 moderate to severe changes of apical emphysema  Social Hx:   Social History   Tobacco Use  . Smoking status: Current Some Day Smoker    Years: 45.00    Types: Cigarettes  . Smokeless tobacco: Never Used  Substance Use Topics  . Alcohol use: No   Frequency: Never  . Drug use: No   Medication:    Current Outpatient Medications:  .  albuterol (PROVENTIL HFA;VENTOLIN HFA) 108 (90 Base) MCG/ACT inhaler, Inhale 2 puffs into the lungs every 6 (six) hours as needed for wheezing or shortness of breath., Disp: , Rfl:  .  LORazepam (ATIVAN) 1 MG tablet, Take 1 mg by mouth daily as needed., Disp: , Rfl:  .  montelukast (SINGULAIR) 10 MG tablet, Take 10 mg by mouth at bedtime., Disp: , Rfl:  .  omeprazole (PRILOSEC) 20 MG capsule, Take 20 mg by mouth daily., Disp: , Rfl:  .  tiotropium (SPIRIVA HANDIHALER) 18 MCG inhalation capsule, Place 1 capsule (18 mcg total) into inhaler and inhale daily., Disp: 30 capsule, Rfl: 5   Allergies:  Spiriva handihaler [tiotropium bromide monohydrate] and Symbicort [budesonide-formoterol fumarate]  Review of Systems: Gen:  Denies  fever, sweats, chills HEENT: Denies blurred vision, double vision. bleeds, sore throat Cvc:  No dizziness, chest pain. Resp:   Denies cough or sputum production, shortness of breath Gi: Denies swallowing difficulty, stomach pain. Gu:  Denies bladder incontinence, burning urine Ext:   No Joint pain, stiffness. Skin: No skin rash,  hives  Endoc:  No polyuria, polydipsia. Psych: No depression, insomnia. Other:  All other systems were reviewed with the patient and were negative other that what is mentioned  in the HPI.   Physical Examination:   VS: BP (!) 142/82 (BP Location: Left Arm, Cuff Size: Normal)   Pulse (!) 110   Ht 5' (1.524 m)   Wt 134 lb (60.8 kg)   SpO2 96%   BMI 26.17 kg/m   General Appearance: No distress  Neuro:without focal findings,  speech normal,  HEENT: PERRLA, EOM intact.  [place] 3.  Pulmonary: normal breath sounds, No wheezing. Decreased air entry bilat.  CardiovascularNormal S1,S2.  No m/r/g.   Abdomen: Benign, Soft, non-tender. Renal:  No costovertebral tenderness  GU:  No performed at this time. Endoc: No evident thyromegaly, no signs of  acromegaly. Skin:   warm, no rashes, no ecchymosis  Extremities: normal, no cyanosis, clubbing.  Other findings:    LABORATORY PANEL:   CBC No results for input(s): WBC, HGB, HCT, PLT in the last 168 hours. ------------------------------------------------------------------------------------------------------------------  Chemistries  No results for input(s): NA, K, CL, CO2, GLUCOSE, BUN, CREATININE, CALCIUM, MG, AST, ALT, ALKPHOS, BILITOT in the last 168 hours.  Invalid input(s): GFRCGP ------------------------------------------------------------------------------------------------------------------  Cardiac Enzymes No results for input(s): TROPONINI in the last 168 hours. ------------------------------------------------------------  RADIOLOGY:  No results found.     Thank  you for the consultation and for allowing [facility] to assist in the care of your patient. Our recommendations are noted above.  Please contact us if we can be of further service.   Marda Stalker, MD.  Board Certified in Internal Medicine, Pulmonary Medicine, [place], and Sleep Medicine.  [facility] Office Number: [phone]  Patricia Pesa, M.D.  Merton Border, M.D  04/19/2018

## 2018-04-19 NOTE — Patient Instructions (Signed)
Congratulations on quitting smoking, this was the best think that you could have done for your health.

## 2018-11-16 NOTE — Progress Notes (Signed)
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 -Chest x-ray consistent with severe emphysema. -Doing well with spiriva, to continue. Had possible allergic reaction to symbicort in the past.  --up to date on flu, s/p PPSV 10/2018.  --Will refer to lung cancer screening.  --PFT before next visit.   Obstructive sleep apnea. -HST 02/03/18; AHI of 18.  --Found CPAP too expensive and was not willing to start, declines referral to a dentist for a dental device.   Tobacco abuse. -Continue to work on smoking cessation. Discussed for 3 min. Is thinking of quitting on 12/31 but does not want assistance.   Orders Placed This Encounter  Procedures  . Pulmonary Function Test [facility] Only   Return in about 6 months (around 05/19/2019).   Date: 11/16/2018  MRN# [id number] Emily PUETT 10-24-1956    Emily Figueroa is a 62 y.o. old female seen in consultation for chief complaint of:    Chief Complaint  Patient presents with  . COPD    pt states breathing stable, she has cough early morning but afterwards she is better.  . Sleep Apnea    not on cpap    HPI:  The patient is a 62 year old female with history of COPD.  He has had a possible allergic reaction to Symbicort in the past, asked to maintain on Spiriva.  Patient also has a history of OSA but did not start CPAP due to cost.  She is using spiriva once daily. She is still smoking about 5 per day, and is trying to quit, and she thinks that Dec. 31st is her quit date. She has been on chantix twice in the past and feels that it helped but does not want to try it again. She has smoked 1-1.5 ppd, started smoking at age of 62.  She is not interested in a referral to a dentist for possible dental device.   She has a history of TMJ.    Desat walk 01/01/18; on RA at rest her sat is 98% and HR 95. She walked 360 feet at brisk pace, moderate dyspena, Sat 95% and HR 101.  Imaging  personally reviewed, chest x-ray 09/17/17, hyperinflation consistent with emphysema.  CT chest 04/09/12 moderate to severe changes of apical emphysema  Social Hx:   Social History   Tobacco Use  . Smoking status: Former Smoker    Years: 45.00    Types: Cigarettes  . Smokeless tobacco: Never Used  Substance Use Topics  . Alcohol use: No    Frequency: Never  . Drug use: No   Medication:    Current Outpatient Medications:  .  albuterol (PROVENTIL HFA;VENTOLIN HFA) 108 (90 Base) MCG/ACT inhaler, Inhale 2 puffs into the lungs every 6 (six) hours as needed for wheezing or shortness of breath., Disp: , Rfl:  .  LORazepam (ATIVAN) 1 MG tablet, Take 1 mg by mouth daily as needed., Disp: , Rfl:  .  montelukast (SINGULAIR) 10 MG tablet, Take 10 mg by mouth at bedtime., Disp: , Rfl:  .  omeprazole (PRILOSEC) 20 MG capsule, Take 20 mg by mouth daily., Disp: , Rfl:  .  tiotropium (SPIRIVA HANDIHALER) 18 MCG inhalation capsule, Place 1 capsule (18 mcg total) into inhaler and inhale daily., Disp: 30 capsule, Rfl: 5   Allergies:  Spiriva handihaler [tiotropium bromide monohydrate] and Symbicort [budesonide-formoterol fumarate]      LABORATORY PANEL:   CBC No results for input(s): WBC,  HGB, HCT, PLT in the last 168 hours. ------------------------------------------------------------------------------------------------------------------  Chemistries  No results for input(s): NA, K, CL, CO2, GLUCOSE, BUN, CREATININE, CALCIUM, MG, AST, ALT, ALKPHOS, BILITOT in the last 168 hours.  Invalid input(s): GFRCGP ------------------------------------------------------------------------------------------------------------------  Cardiac Enzymes No results for input(s): TROPONINI in the last 168 hours. ------------------------------------------------------------  RADIOLOGY:  No results found.     Thank  you for the consultation and for allowing [facility] to assist in the  care of your patient. Our recommendations are noted above.  Please contact us if we can be of further service.  Marda Stalker, M.D., F.C.C.P.  Board Certified in Internal Medicine, Pulmonary Medicine, [place], and Sleep Medicine.  [facility] Office Number: [phone]   11/16/2018

## 2018-11-17 ENCOUNTER — Ambulatory Visit: Payer: BLUE CROSS/BLUE SHIELD | Admitting: Internal Medicine

## 2018-11-17 ENCOUNTER — Encounter: Payer: Self-pay | Admitting: Internal Medicine

## 2018-11-17 VITALS — BP 110/66 | HR 103 | Resp 16 | Ht 60.0 in | Wt 129.0 lb

## 2018-11-17 DIAGNOSIS — G4733 Obstructive sleep apnea (adult) (pediatric): Secondary | ICD-10-CM

## 2018-11-17 DIAGNOSIS — J449 Chronic obstructive pulmonary disease, unspecified: Secondary | ICD-10-CM

## 2018-11-17 NOTE — Patient Instructions (Addendum)
--  Quitting smoking is the most important thing that you can do for your health.  --Quitting smoking will have greater affect on your health than any medicine that we can give you.   Continue spiriva.  Will refer to lung cancer screening.

## 2018-11-18 ENCOUNTER — Telehealth: Payer: Self-pay | Admitting: *Deleted

## 2018-11-18 ENCOUNTER — Encounter: Payer: Self-pay | Admitting: *Deleted

## 2018-11-18 DIAGNOSIS — Z87891 Personal history of nicotine dependence: Secondary | ICD-10-CM

## 2018-11-18 DIAGNOSIS — Z122 Encounter for screening for malignant neoplasm of respiratory organs: Secondary | ICD-10-CM

## 2018-11-18 NOTE — Telephone Encounter (Signed)
Received referral for initial lung cancer screening scan. Contacted patient and obtained smoking history,(current, 70.5 pack year) as well as answering questions related to screening process. Patient denies signs of lung cancer such as weight loss or hemoptysis. Patient denies comorbidity that would prevent curative treatment if lung cancer were found. Patient is scheduled for shared decision making visit and CT scan on 12/10/18 at 930.

## 2018-12-06 ENCOUNTER — Telehealth: Payer: Self-pay | Admitting: *Deleted

## 2018-12-06 NOTE — Telephone Encounter (Signed)
Called patient to inform them of their appt for LDCT screening on Friday 12-10-18 @ 0930, message left for patient.

## 2018-12-10 ENCOUNTER — Ambulatory Visit
Admission: RE | Admit: 2018-12-10 | Discharge: 2018-12-10 | Disposition: A | Discharge status: Home / Self Care | Payer: BLUE CROSS/BLUE SHIELD | Source: Ambulatory Visit | Attending: Oncology | Admitting: Oncology

## 2018-12-10 ENCOUNTER — Inpatient Hospital Stay: Payer: BLUE CROSS/BLUE SHIELD | Attending: Nurse Practitioner | Admitting: Nurse Practitioner

## 2018-12-10 DIAGNOSIS — Z87891 Personal history of nicotine dependence: Secondary | ICD-10-CM

## 2018-12-10 DIAGNOSIS — F17200 Nicotine dependence, unspecified, uncomplicated: Secondary | ICD-10-CM | POA: Insufficient documentation

## 2018-12-10 DIAGNOSIS — Z122 Encounter for screening for malignant neoplasm of respiratory organs: Secondary | ICD-10-CM | POA: Insufficient documentation

## 2018-12-10 NOTE — Progress Notes (Signed)
In accordance with CMS guidelines, patient has met eligibility criteria including age, absence of signs or symptoms of lung cancer.  Social History   Tobacco Use  . Smoking status: Current Every Day Smoker    Packs/day: 1.50    Years: 47.00    Pack years: 70.50    Types: Cigarettes  . Smokeless tobacco: Never Used  Substance Use Topics  . Alcohol use: No    Frequency: Never  . Drug use: No     A shared decision-making session was conducted prior to the performance of CT scan. This includes one or more decision aids, includes benefits and harms of screening, follow-up diagnostic testing, over-diagnosis, false positive rate, and total radiation exposure.   Counseling on the importance of adherence to annual lung cancer LDCT screening, impact of co-morbidities, and ability or willingness to undergo diagnosis and treatment is imperative for compliance of the program.  Counseling on the importance of continued smoking cessation for former smokers; the importance of smoking cessation for current smokers, and information about tobacco cessation interventions have been given to patient including [facility] and 1800 quit [place] programs.  Written order for lung cancer screening with LDCT has been given to the patient and any and all questions have been answered to the best of my abilities.   Yearly follow up will be coordinated by Burgess Estelle, Thoracic Navigator.  Beckey Rutter, DNP, AGNP-C [place] at [facility] [phone] (work cell) [phone] (office)

## 2018-12-13 ENCOUNTER — Encounter: Payer: Self-pay | Admitting: *Deleted

## 2019-06-08 ENCOUNTER — Other Ambulatory Visit (HOSPITAL_COMMUNITY)
Admission: RE | Admit: 2019-06-08 | Discharge: 2019-06-08 | Disposition: A | Discharge status: Home / Self Care | Payer: BC Managed Care – PPO | Source: Ambulatory Visit | Attending: Obstetrics and Gynecology | Admitting: Obstetrics and Gynecology

## 2019-06-08 ENCOUNTER — Encounter: Payer: Self-pay | Admitting: Obstetrics and Gynecology

## 2019-06-08 ENCOUNTER — Ambulatory Visit: Payer: BC Managed Care – PPO | Admitting: Obstetrics and Gynecology

## 2019-06-08 ENCOUNTER — Other Ambulatory Visit: Payer: Self-pay

## 2019-06-08 VITALS — BP 140/80 | HR 95 | Ht 60.0 in | Wt 132.0 lb

## 2019-06-08 DIAGNOSIS — Z0000 Encounter for general adult medical examination without abnormal findings: Secondary | ICD-10-CM | POA: Diagnosis present

## 2019-06-08 DIAGNOSIS — Z124 Encounter for screening for malignant neoplasm of cervix: Secondary | ICD-10-CM

## 2019-06-08 DIAGNOSIS — Z01419 Encounter for gynecological examination (general) (routine) without abnormal findings: Secondary | ICD-10-CM | POA: Diagnosis not present

## 2019-06-08 NOTE — Progress Notes (Signed)
Gynecology Annual Exam  PCP: Laneta Simmers, NP  Chief Complaint:  Chief Complaint  Patient presents with  . Follow-up    Pap smear, last pap 15 years ago     History of Present Illness:Patient is a 63 y.o. [id number] presents for annual exam. The patient has no complaints today.   LMP: No LMP recorded. Patient is postmenopausal. Menarche:14  MENOPAUSE: 48 Average Interval: regular, monthly  Heavy Menses: no Clots: no Intermenstrual Bleeding: no Postcoital Bleeding: no Dysmenorrhea: no  The patient is not currently sexually active. She denies dyspareunia.  The patient does not perform self breast exams.  There is notable family history of breast or ovarian cancer in her family.  The patient wears seatbelts: yes.   The patient has regular exercise: yes.    The patient denies current symptoms of depression.     Review of Systems: ROS  Past Medical History:  Past Medical History:  Diagnosis Date  . Abdominal pain, generalized   . Arthritis   . Bell's palsy   . Chicken pox   . COPD (chronic obstructive pulmonary disease) ([place])   . Diarrhea   . Dysphagia   . GERD (gastroesophageal reflux disease)   . Hypertension   . Weight loss     Past Surgical History:  Past Surgical History:  Procedure Laterality Date  . COLONOSCOPY WITH PROPOFOL N/A 09/14/2015   Procedure: COLONOSCOPY WITH PROPOFOL;  Surgeon: Manya Silvas, MD;  Location: [facility] ENDOSCOPY;  Service: Endoscopy;  Laterality: N/A;  . DIAGNOSTIC LAPAROSCOPY    . ESOPHAGOGASTRODUODENOSCOPY (EGD) WITH PROPOFOL N/A 09/14/2015   Procedure: ESOPHAGOGASTRODUODENOSCOPY (EGD) WITH PROPOFOL;  Surgeon: Manya Silvas, MD;  Location[facility] ENDOSCOPY;  Service: Endoscopy;  Laterality: N/A;  . SAVORY DILATION N/A 09/14/2015   Procedure: SAVORY DILATION;  Surgeon: Manya Silvas, MD;  Location: [facility] ENDOSCOPY;  Service: Endoscopy;  Laterality: N/A;  . TEMPOROMANDIBULAR JOINT SURGERY      Gynecologic History:  No LMP  recorded. Patient is postmenopausal. 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 Leukemia Mother   . Heart Problems Mother     Social History:  Social History   Socioeconomic History  . Marital status: Married    Spouse name: Not on file  . Number of children: Not on file  . Years of education: Not on file  . Highest education level: Not on file  Occupational History  . Not on file  Social Needs  . Financial resource strain: Not on file  . Food insecurity    Worry: Not on file    Inability: Not on file  . Transportation needs    Medical: Not on file    Non-medical: Not on file  Tobacco Use  . Smoking status: Current Every Day Smoker    Packs/day: 1.50    Years: 47.00    Pack years: 70.50    Types: Cigarettes  . Smokeless tobacco: Never Used  Substance and Sexual Activity  . Alcohol use: No    Frequency: Never  . Drug use: No  . Sexual activity: Not Currently    Birth control/protection: Post-menopausal  Lifestyle  . Physical activity    Days per week: Not on file    Minutes per session: Not on file  .  Stress: Not on file  Relationships  . Social Herbalist on phone: Not on file    Gets together: Not on file    Attends religious service: Not on file    Active member of club or organization: Not on file    Attends meetings of clubs or organizations: Not on file    Relationship status: Not on file  . Intimate partner violence    Fear of current or ex partner: Not on file    Emotionally abused: Not on file    Physically abused: Not on file    Forced sexual activity: Not on file  Other Topics Concern  . Not on file  Social History Narrative  . Not on file    Allergies:  Allergies  Allergen Reactions  . Spiriva Handihaler [Tiotropium Bromide  Monohydrate] Hives  . Symbicort [Budesonide-Formoterol Fumarate] Hives    Medications: Prior to Admission medications   Medication Sig Start Date End Date Taking? Authorizing Provider  fluticasone (FLONASE) 50 MCG/ACT nasal spray  02/01/19  Yes [provider]  gabapentin (NEURONTIN) 100 MG capsule  06/07/19  Yes [provider]  omeprazole (PRILOSEC) 20 MG capsule Take 20 mg by mouth daily.   Yes [provider]  tiotropium (SPIRIVA HANDIHALER) 18 MCG inhalation capsule Place 1 capsule (18 mcg total) into inhaler and inhale daily. 01/08/18 01/08/19  Laverle Hobby, MD    Physical Exam Vitals: Blood pressure 140/80, pulse 95, height 5' (1.524 m), weight 132 lb (59.9 kg).  General: NAD HEENT: normocephalic, anicteric Thyroid: no enlargement, no palpable nodules Pulmonary: No increased work of breathing, CTAB Cardiovascular: RRR, distal pulses 2+ Breast: Breast symmetrical, no tenderness, no palpable nodules or masses, no skin or nipple retraction present, no nipple discharge.  No axillary or supraclavicular lymphadenopathy. Abdomen: NABS, soft, non-tender, non-distended.  Umbilicus without lesions.  No hepatomegaly, splenomegaly or masses palpable. No evidence of hernia  Genitourinary:  External: Normal external female genitalia.  Normal urethral meatus, normal Bartholin's and Skene's glands.     Vagina: Normal vaginal mucosa, no evidence of prolapse.  NO WARTS OF LESIONS SEEN  Cervix: Grossly normal in appearance, no bleeding  Uterus: Non-enlarged, mobile, normal contour.  No CMT  Adnexa: ovaries non-enlarged, no adnexal masses  Rectal: deferred  Lymphatic: no evidence of inguinal lymphadenopathy Extremities: no edema, erythema, or tenderness Neurologic: Grossly intact Psychiatric: mood appropriate, affect full  Female chaperone present for pelvic and breast  portions of the physical exam     Assessment: 63 y.o. [id number] routine annual exam  Plan:  Problem List Items Addressed This Visit    None    Visit Diagnoses    Cervical cancer screening    -  Primary   Relevant Orders   Cytology - PAP   Health care maintenance       Relevant Orders   Cytology - PAP      1) Mammogram - recommend yearly screening mammogram.  Mammogram Is up to date per patient  2) STI screening  was not offered and therefore not obtained  3) ASCCP guidelines and rational discussed.  Patient opts for every 5 years screening interval  4) Osteoporosis  - per USPTF routine screening DEXA at age 81 - FRAX 67 year major fracture risk 9.1,  10 year hip fracture risk 1.4  Consider FDA-approved medical therapies in postmenopausal women and men aged 88 years and older, based on the following: a) A hip or vertebral (clinical or morphometric) fracture b)  T-score ? -2.5 at the femoral neck or spine after appropriate evaluation to exclude secondary causes C) Low bone mass (T-score between -1.0 and -2.5 at the femoral neck or spine) and a 10-year probability of a hip fracture ? 3% or a 10-year probability of a major osteoporosis-related fracture ? 20% based on the [place]-adapted WHO algorithm   5) Routine healthcare maintenance including cholesterol, diabetes screening discussed managed by PCP  6) Colonoscopy was completed in 2017 per patient: no polyps found .  Screening recommended starting at age 62 for average risk individuals, age 9 for individuals deemed at increased risk (including African Americans) and recommended to continue until age 30.  For patient age 22-85 individualized approach is recommended.  Gold standard screening is via colonoscopy, Cologuard screening is an acceptable alternative for patient unwilling or unable to undergo colonoscopy.  "Colorectal cancer screening for average?risk adults: 2018 guideline update from the American Cancer Society"[place]: A Cancer Journal for Clinicians: May 13, 2017   7) Return in about 1 year (around 06/07/2020).    Adrian Prows MD [facility] 06/08/2019 4:41 PM

## 2019-06-10 LAB — CYTOLOGY - PAP
Diagnosis: NEGATIVE
HPV: NOT DETECTED

## 2019-09-30 ENCOUNTER — Encounter: Payer: Self-pay | Admitting: Internal Medicine

## 2019-09-30 ENCOUNTER — Other Ambulatory Visit: Payer: Self-pay

## 2019-09-30 ENCOUNTER — Ambulatory Visit: Payer: BC Managed Care – PPO | Admitting: Internal Medicine

## 2019-09-30 VITALS — BP 142/84 | HR 96 | Temp 97.1°F | Ht 60.0 in | Wt 130.6 lb

## 2019-09-30 DIAGNOSIS — R0602 Shortness of breath: Secondary | ICD-10-CM | POA: Diagnosis not present

## 2019-09-30 DIAGNOSIS — F17210 Nicotine dependence, cigarettes, uncomplicated: Secondary | ICD-10-CM

## 2019-09-30 DIAGNOSIS — J449 Chronic obstructive pulmonary disease, unspecified: Secondary | ICD-10-CM

## 2019-09-30 MED ORDER — SPIRIVA HANDIHALER 18 MCG IN CAPS: 18.0000 ug | ORAL_CAPSULE | Freq: Every day | RESPIRATORY_TRACT | 5 refills | Status: DC

## 2019-09-30 MED ORDER — ALBUTEROL SULFATE HFA 108 (90 BASE) MCG/ACT IN AERS: 2.0000 | INHALATION_SPRAY | RESPIRATORY_TRACT | 6 refills | Status: AC | PRN

## 2019-09-30 NOTE — Patient Instructions (Signed)
Recommend smoking cessation  Continue inhalers as prescribed Continue with Spiriva Continue with albuterol as needed  Obtain pulmonary function tests Obtain 6-minute walk test Obtain overnight pulse oximetry

## 2019-09-30 NOTE — Progress Notes (Signed)
[facility] Consultation    SYNOPSIS  The patient is a 63 year old female with history of COPD.  He has had a possible allergic reaction to Symbicort in the past, asked to maintain on Spiriva.  Patient also has a history of OSA but did not start CPAP due to cost.  She is using spiriva once daily. She is still smoking about 5 per day, and is trying to quit, and she thinks that Dec. 31st is her quit date. She has been on chantix twice in the past and feels that it helped but does not want to try it again. She has smoked 1-1.5 ppd, started smoking at age of 37.  She is not interested in a referral to a dentist for possible dental device.   She has a history of TMJ.    Desat walk 01/01/18; on RA at rest her sat is 98% and HR 95. She walked 360 feet at brisk pace, moderate dyspena, Sat 95% and HR 101.  Imaging personally reviewed, chest x-ray 09/17/17, hyperinflation consistent with emphysema.  CT chest 04/09/12 moderate to severe changes of apical emphysema  Date: 09/30/2019  MRN# [id number] Emily Figueroa 1956-08-29    Emily Figueroa is a 63 y.o. old female seen in consultation for chief complaint of:  CC follow up COPD  HPI:  Chronic shortness of breath chronic dyspnea on exertion Chronic wheezing with intermittent cough Spiriva does help  Patient still smokes No hands and symptoms for exacerbation at this time Patient works at [employer] up at 3 AM every day  Has a lot of stress in her life That is why she continues to smoke  Patient has progressive dyspnea exertion and shortness of breath over the last 1 year Patient needs assessment of exertional and nocturnal hypoxia   No of COPD exacerbation at this time No evidence of heart failure at this time No evidence or signs of infection at this time No respiratory distress No fevers, chills, nausea, vomiting, diarrhea No evidence of lower extremity edema No evidence hemoptysis   Smoking  Assessment and Cessation Counseling   Upon further questioning, Patient smokes 1/2 PPD  I have advised patient to quit/stop smoking as soon as possible due to high risk for multiple medical problems  Patient is NOT willing to quit smoking  I have advised patient that we can assist and have options of Nicotine replacement therapy. I also advised patient on behavioral therapy and can provide oral medication therapy in conjunction with the other therapies  Follow up next Office visit  for assessment of smoking cessation  Smoking cessation counseling advised for 4 minutes     Review of Systems:  Gen:  Denies  fever, sweats, chills weight loss  HEENT: Denies blurred vision, double vision, ear pain, eye pain, hearing loss, nose bleeds, sore throat Cardiac:  No dizziness, chest pain or heaviness, chest tightness,edema, No JVD Resp:   +cough, -sputum production, +shortness of breath,-wheezing, -hemoptysis,  Gi: Denies swallowing difficulty, stomach pain, nausea or vomiting, diarrhea, constipation, bowel incontinence Gu:  Denies bladder incontinence, burning urine Ext:   Denies Joint pain, stiffness or swelling Skin: Denies  skin rash, easy bruising or bleeding or hives Endoc:  Denies polyuria, polydipsia , polyphagia or weight change Psych:   Denies depression, insomnia or hallucinations  Other:  All other systems negative  BP (!) 142/84   Pulse 96   Temp (!) 97.1 F (36.2 C) (Temporal)   Ht 5' (1.524 m)  Wt 130 lb 9.6 oz (59.2 kg)   SpO2 97%   BMI 25.51 kg/m   Physical Examination:   GENERAL:NAD, no fevers, chills, no weakness no fatigue HEAD: Normocephalic, atraumatic.  EYES: PERLA, EOMI No scleral icterus.  NECK: Supple. No thyromegaly.  No JVD.  PULMONARY: CTA B/L no wheezing, rhonchi, crackles CARDIOVASCULAR: S1 and S2. Regular rate and rhythm. No murmurs GASTROINTESTINAL: Soft, nontender, nondistended. Positive bowel sounds.  MUSCULOSKELETAL: No swelling, clubbing,  or edema.  NEUROLOGIC: No gross focal neurological deficits. 5/5 strength all extremities SKIN: No ulceration, lesions, rashes, or cyanosis.  PSYCHIATRIC: Insight, judgment intact. -depression -anxiety ALL OTHER ROS ARE NEGATIVE       Social Hx:   Social History   Tobacco Use  . Smoking status: Current Every Day Smoker    Packs/day: 1.50    Years: 47.00    Pack years: 70.50    Types: Cigarettes  . Smokeless tobacco: Never Used  Substance Use Topics  . Alcohol use: No    Frequency: Never  . Drug use: No   Medication:    Current Outpatient Medications:  .  fluticasone (FLONASE) 50 MCG/ACT nasal spray, , Disp: , Rfl:  .  gabapentin (NEURONTIN) 100 MG capsule, , Disp: , Rfl:  .  omeprazole (PRILOSEC) 20 MG capsule, Take 20 mg by mouth daily., Disp: , Rfl:  .  tiotropium (SPIRIVA HANDIHALER) 18 MCG inhalation capsule, Place 1 capsule (18 mcg total) into inhaler and inhale daily., Disp: 30 capsule, Rfl: 5   Allergies:  Spiriva handihaler [tiotropium bromide monohydrate] and Symbicort [budesonide-formoterol fumarate]    Assessment and Plan:  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 and progressive shortness of breath Chest x-ray is consistent with severe emphysema Patient needs pulmonary function testing for assessment of lung function Patient is doing well with Spiriva Patient needs albuterol as needed   Progressive worsening shortness of breath and dyspnea exertion Patient needs 6-minute walk test to assess for exertional hypoxia Patient needs overnight pulse oximetry to assess for nocturnal hypoxia  Tobacco abuse Smoking cessation strongly advised   COVID-19 EDUCATION: The signs and symptoms of COVID-19 were discussed with the patient and how to seek care for testing.  The importance of social distancing was discussed today. Hand Washing Techniques and avoid touching face was advised.     MEDICATION ADJUSTMENTS/LABS AND TESTS ORDERED: Recommend smoking  cessation  Continue inhalers as prescribed Continue with Spiriva Continue with albuterol as needed  Obtain pulmonary function tests Obtain 6-minute walk test Obtain overnight pulse oximetry   CURRENT MEDICATIONS REVIEWED AT LENGTH WITH PATIENT TODAY   Patient satisfied with Plan of action and management. All questions answered  Follow up in 3 to 6 months   Joellyn Grandt Patricia Pesa, M.D.  [facility]  Medical Director [place] Director [facility]io-Pulmonary Department

## 2019-10-06 ENCOUNTER — Encounter: Payer: Self-pay | Admitting: Internal Medicine

## 2019-10-11 ENCOUNTER — Ambulatory Visit: Payer: BC Managed Care – PPO

## 2019-10-11 ENCOUNTER — Other Ambulatory Visit: Payer: Self-pay

## 2019-10-11 ENCOUNTER — Ambulatory Visit: Payer: BC Managed Care – PPO | Admitting: Internal Medicine

## 2019-10-11 DIAGNOSIS — J449 Chronic obstructive pulmonary disease, unspecified: Secondary | ICD-10-CM

## 2019-10-12 NOTE — Progress Notes (Signed)
SIX MIN WALK 10/12/2019  Medications Albuterol, Omeprazole 20mg , and Tylenol 1000mg  at 7am.  Supplimental Oxygen during Test? (L/min) No  Laps 14  Partial Lap (in Meters) 3  Baseline BP (sitting) 122/82  Baseline Heartrate 110  Baseline Dyspnea (Borg Scale) 1  Baseline Fatigue (Borg Scale) 1  Baseline SPO2 97  BP (sitting) 138/80  Heartrate 110  Dyspnea (Borg Scale) 2  Fatigue (Borg Scale) 2  SPO2 97  BP (sitting) 124/80  Heartrate 108  SPO2 98  Stopped or Paused before Six Minutes No  Distance Completed 479  Tech Comments: Patient walked a fast pace. Reports feeling SOB but no worse than her baseline SOB. Tolerated well.    Patient completed her 6MW without difficulty. Tolerated well.

## 2019-10-14 ENCOUNTER — Telehealth: Payer: Self-pay | Admitting: Internal Medicine

## 2019-10-14 DIAGNOSIS — J449 Chronic obstructive pulmonary disease, unspecified: Secondary | ICD-10-CM

## 2019-10-14 NOTE — Telephone Encounter (Signed)
ONO reviewed by DK- recommend 1L QHS.  Left message to make pt aware of results.

## 2019-10-17 NOTE — Telephone Encounter (Signed)
Pt is aware of results and wished to proceed with nighttime oxygen. Order has been placed. Nothing further is needed.

## 2019-10-17 NOTE — Telephone Encounter (Signed)
Pt calling back for results

## 2019-10-17 NOTE — Telephone Encounter (Signed)
Left message for pt

## 2019-10-20 ENCOUNTER — Telehealth: Payer: Self-pay | Admitting: Internal Medicine

## 2019-10-20 NOTE — Telephone Encounter (Signed)
LM to make pt aware of date/time of covid test.  10/24/2019 prior to [facility]

## 2019-10-21 ENCOUNTER — Other Ambulatory Visit: Payer: Self-pay

## 2019-10-21 NOTE — Telephone Encounter (Signed)
Left message x2 for pt. 

## 2019-10-21 NOTE — Telephone Encounter (Signed)
Pt is aware of below date/time of covid test and voiced her understanding.

## 2019-10-24 ENCOUNTER — Other Ambulatory Visit: Payer: Self-pay

## 2019-10-24 ENCOUNTER — Other Ambulatory Visit
Admission: RE | Admit: 2019-10-24 | Discharge: 2019-10-24 | Disposition: A | Discharge status: Home / Self Care | Payer: BC Managed Care – PPO | Source: Ambulatory Visit | Attending: Internal Medicine | Admitting: Internal Medicine

## 2019-10-24 DIAGNOSIS — Z20828 Contact with and (suspected) exposure to other viral communicable diseases: Secondary | ICD-10-CM | POA: Insufficient documentation

## 2019-10-24 DIAGNOSIS — Z01812 Encounter for preprocedural laboratory examination: Secondary | ICD-10-CM | POA: Insufficient documentation

## 2019-10-24 LAB — SARS CORONAVIRUS 2 (TAT 6-24 HRS): SARS Coronavirus 2: NEGATIVE

## 2019-10-25 ENCOUNTER — Other Ambulatory Visit: Payer: Self-pay

## 2019-10-25 ENCOUNTER — Ambulatory Visit: Payer: BC Managed Care – PPO | Attending: Internal Medicine

## 2019-10-25 DIAGNOSIS — J449 Chronic obstructive pulmonary disease, unspecified: Secondary | ICD-10-CM | POA: Diagnosis present

## 2019-10-25 MED ORDER — ALBUTEROL SULFATE (2.5 MG/3ML) 0.083% IN NEBU
2.5000 mg | INHALATION_SOLUTION | Freq: Once | RESPIRATORY_TRACT | Status: AC
  Administered 2019-10-25: 2.5 mg via RESPIRATORY_TRACT
  Filled 2019-10-25: qty 3

## 2019-11-29 ENCOUNTER — Telehealth: Payer: Self-pay | Admitting: *Deleted

## 2019-11-29 DIAGNOSIS — Z87891 Personal history of nicotine dependence: Secondary | ICD-10-CM

## 2019-11-29 NOTE — Telephone Encounter (Signed)
Patient has been notified that lung cancer screening CT scan is due currently or will be in near future. Confirmed that patient is within the appropriate age range, and asymptomatic, (no signs or symptoms of lung cancer). Patient denies illness that would prevent curative treatment for lung cancer if found. Verified smoking history. She is a current smoker that smokes five cigarettes per day. She have been smoking for 48 years. Patient is agreeable for CT scan being scheduled but is limited to due her work schedule. She is available on December 21st, 23rd, 30th or the 31st. She does not have a preference on the time of the day.

## 2019-11-30 NOTE — Telephone Encounter (Signed)
Smoking history current, 70.75 pack year

## 2019-11-30 NOTE — Addendum Note (Signed)
Addended by: Lieutenant Diego on: 11/30/2019 02:53 PM   Modules accepted: Orders

## 2019-12-13 ENCOUNTER — Telehealth: Payer: Self-pay | Admitting: Internal Medicine

## 2019-12-13 DIAGNOSIS — J449 Chronic obstructive pulmonary disease, unspecified: Secondary | ICD-10-CM

## 2019-12-13 NOTE — Telephone Encounter (Signed)
Called and spoke to pt, who is requesting to switch from [facility] to another DME.  Order has been placed to switch companies. Nothing further is needed.

## 2019-12-14 ENCOUNTER — Other Ambulatory Visit: Payer: Self-pay

## 2019-12-14 ENCOUNTER — Ambulatory Visit
Admission: RE | Admit: 2019-12-14 | Discharge: 2019-12-14 | Disposition: A | Discharge status: Home / Self Care | Payer: BC Managed Care – PPO | Source: Ambulatory Visit | Attending: Oncology | Admitting: Oncology

## 2019-12-14 DIAGNOSIS — Z87891 Personal history of nicotine dependence: Secondary | ICD-10-CM | POA: Diagnosis present

## 2019-12-15 ENCOUNTER — Telehealth: Payer: Self-pay | Admitting: *Deleted

## 2019-12-15 NOTE — Telephone Encounter (Signed)
Notified patient of LDCT lung cancer screening program results with recommendation for 12 month follow up imaging. Also notified of incidental findings noted below and is encouraged to discuss further with PCP who will receive a copy of this note and/or the CT report. Patient verbalizes understanding.   IMPRESSION: 1. Lung-RADS 1, negative. Continue annual screening with low-dose chest CT without contrast in 12 months. 2. 8 mm splenic artery aneurysm, stable. 3.  Emphysema (ICD10-J43.9). 4. Aortic atherosclerosis (ICD10-I70.0). Coronary artery calcification.

## 2020-04-12 ENCOUNTER — Other Ambulatory Visit: Payer: Self-pay

## 2020-04-12 ENCOUNTER — Ambulatory Visit: Payer: BC Managed Care – PPO | Admitting: Dermatology

## 2020-04-12 DIAGNOSIS — D2262 Melanocytic nevi of left upper limb, including shoulder: Secondary | ICD-10-CM

## 2020-04-12 DIAGNOSIS — Z85828 Personal history of other malignant neoplasm of skin: Secondary | ICD-10-CM

## 2020-04-12 DIAGNOSIS — D2261 Melanocytic nevi of right upper limb, including shoulder: Secondary | ICD-10-CM

## 2020-04-12 DIAGNOSIS — L814 Other melanin hyperpigmentation: Secondary | ICD-10-CM

## 2020-04-12 DIAGNOSIS — L578 Other skin changes due to chronic exposure to nonionizing radiation: Secondary | ICD-10-CM

## 2020-04-12 DIAGNOSIS — L821 Other seborrheic keratosis: Secondary | ICD-10-CM

## 2020-04-12 DIAGNOSIS — D229 Melanocytic nevi, unspecified: Secondary | ICD-10-CM

## 2020-04-12 NOTE — Patient Instructions (Addendum)
Cordran tape  Apply a small piece to cover itchy mole on left posterior shoulder daily as needed.

## 2020-04-12 NOTE — Progress Notes (Signed)
   New Patient Visit  Subjective  Emily Figueroa is a 64 y.o. female who presents for the following: check spot (L post shoulder x 44m, itchy), check spot (L abdomen, noticed 49m ago), and Hx of skin ca per pt (R chin, R arm, txted in past by Dr. Koleen Nimrod).  Not sure which type.   The following portions of the chart were reviewed this encounter and updated as appropriate:      Review of Systems:  No other skin or systemic complaints except as noted in HPI or Assessment and Plan.  Objective  Well appearing patient in no apparent distress; mood and affect are within normal limits.  A focused examination was performed including face, trunk, arms. Relevant physical exam findings are noted in the Assessment and Plan.  Objective    Left post shoulder at braline: 9.0 x 7.85mm pink brown flat pap  Right posterior shoulder: 7.71mm pink flesh pap  Objective  Left inframammary: 10.0 x 5.20mm waxy tan macule   Assessment & Plan    History of Skin Cancer of the Skin - R chin, R arm clear today - No evidence of recurrence today - Recommend regular full body skin exams - Recommend daily broad spectrum sunscreen SPF 30+ to sun-exposed areas, reapply every 2 hours as needed.  - Call if any new or changing lesions are noted between office visits  Actinic Damage - diffuse scaly erythematous macules with underlying dyspigmentation - Recommend daily broad spectrum sunscreen SPF 30+ to sun-exposed areas, reapply every 2 hours as needed.  - Call for new or changing lesions.  Lentigines - Scattered tan macules - Discussed due to sun exposure - Benign, observe - Call for any changes  Nevus (2) Left post shoulder at braline; Right posterior shoulder  Nevus with Pruritus vs irritated Dermatofibroma L post shoulder Discussed topical txt vs IL steroid injection vs excising  Start Cordran tape qd prn itch, sample given to pt.  Nevus R post shoulder, benign-appearing, observe  Seborrheic  keratosis Left inframammary  Benign, observe.    Return if symptoms worsen or fail to improve.     Documentation: I have reviewed the above documentation for accuracy and completeness, and I agree with the above.  Brendolyn Patty, MD   I, Othelia Pulling, RMA, am acting as scribe for Brendolyn Patty, MD .

## 2020-07-21 ENCOUNTER — Other Ambulatory Visit: Payer: Self-pay | Admitting: Family

## 2020-07-21 DIAGNOSIS — R1030 Lower abdominal pain, unspecified: Secondary | ICD-10-CM

## 2020-07-21 DIAGNOSIS — K921 Melena: Secondary | ICD-10-CM

## 2020-07-21 DIAGNOSIS — R1013 Epigastric pain: Secondary | ICD-10-CM

## 2020-08-06 ENCOUNTER — Ambulatory Visit
Admission: RE | Admit: 2020-08-06 | Discharge: 2020-08-06 | Disposition: A | Discharge status: Home / Self Care | Payer: BC Managed Care – PPO | Source: Ambulatory Visit | Attending: Family | Admitting: Family

## 2020-08-06 ENCOUNTER — Other Ambulatory Visit: Payer: Self-pay

## 2020-08-06 DIAGNOSIS — K921 Melena: Secondary | ICD-10-CM

## 2020-08-06 DIAGNOSIS — R1030 Lower abdominal pain, unspecified: Secondary | ICD-10-CM | POA: Diagnosis present

## 2020-08-06 DIAGNOSIS — R1013 Epigastric pain: Secondary | ICD-10-CM | POA: Diagnosis present

## 2020-08-06 MED ORDER — IOHEXOL 300 MG/ML  SOLN
100.0000 mL | Freq: Once | INTRAMUSCULAR | Status: AC | PRN
  Administered 2020-08-06: 85 mL via INTRAVENOUS

## 2020-08-29 ENCOUNTER — Telehealth: Payer: Self-pay | Admitting: Internal Medicine

## 2020-08-29 DIAGNOSIS — J449 Chronic obstructive pulmonary disease, unspecified: Secondary | ICD-10-CM

## 2020-08-29 NOTE — Telephone Encounter (Signed)
Called and spoke to patient.  Patient is requesting for order to be placed to [facility] to pickup backup oxygen tanks. Patient stated that she pays 135 dollars a month for the backup tanks. Her husband was recently dx with cancer and she is trying to save money.  Patient is aware that backup tanks are needed in case of a power outage. Patient does not feel that she needs these tanks.  Dr. Mortimer Fries, please advise. Thanks.

## 2020-08-29 NOTE — Telephone Encounter (Signed)
Order has been placed to [facility] to d/c backup tanks. Patient is aware and voiced her understanding.  Nothing further is needed at this time.

## 2020-08-29 NOTE — Telephone Encounter (Signed)
Please honor patients request

## 2020-10-16 ENCOUNTER — Other Ambulatory Visit: Payer: Self-pay | Admitting: Internal Medicine

## 2020-10-16 DIAGNOSIS — J449 Chronic obstructive pulmonary disease, unspecified: Secondary | ICD-10-CM

## 2020-10-16 MED ORDER — SPIRIVA HANDIHALER 18 MCG IN CAPS: 18.0000 ug | ORAL_CAPSULE | Freq: Every day | RESPIRATORY_TRACT | 0 refills | Status: DC

## 2020-10-16 NOTE — Telephone Encounter (Signed)
Spoke to patient, who is requesting refill on Spiriva 60mcg.  Patient is past due for an appt. Recall has been placed for 81mo, as NP's schedule is not out for December and Dr. Mortimer Fries does not have any availability. One month supply of Spiriva 14mcg has been sent to preferred pharmacy. Nothing further needed.

## 2020-12-04 ENCOUNTER — Encounter: Payer: Self-pay | Admitting: *Deleted

## 2020-12-04 ENCOUNTER — Telehealth: Payer: Self-pay | Admitting: *Deleted

## 2020-12-04 NOTE — Telephone Encounter (Signed)
Attempted to contact and schedule lung screening scan. Message left for patient to call back to schedule. 

## 2020-12-05 ENCOUNTER — Telehealth: Payer: Self-pay | Admitting: *Deleted

## 2020-12-05 NOTE — Telephone Encounter (Signed)
Attempted to contact and schedule lung screening scan. Message left for patient to call back to schedule. 

## 2020-12-06 ENCOUNTER — Other Ambulatory Visit: Payer: Self-pay | Admitting: *Deleted

## 2020-12-06 DIAGNOSIS — Z87891 Personal history of nicotine dependence: Secondary | ICD-10-CM

## 2020-12-06 DIAGNOSIS — Z122 Encounter for screening for malignant neoplasm of respiratory organs: Secondary | ICD-10-CM

## 2020-12-06 NOTE — Progress Notes (Signed)
Contacted and scheduled for lung screening scan. Patient is a current smoker with a 71.25 pack year history.

## 2020-12-19 ENCOUNTER — Other Ambulatory Visit: Payer: Self-pay | Admitting: Nurse Practitioner

## 2020-12-19 DIAGNOSIS — J449 Chronic obstructive pulmonary disease, unspecified: Secondary | ICD-10-CM

## 2020-12-19 DIAGNOSIS — U071 COVID-19: Secondary | ICD-10-CM

## 2020-12-19 DIAGNOSIS — Z87891 Personal history of nicotine dependence: Secondary | ICD-10-CM

## 2020-12-19 NOTE — Progress Notes (Signed)
I connected by phone with Emily Figueroa on 12/19/2020 at 11:44 AM to discuss the potential use of a new treatment for mild to moderate COVID-19 viral infection in non-hospitalized patients.  This patient is a 65 y.o. female that meets the FDA criteria for Emergency Use Authorization of COVID monoclonal antibody casirivimab/imdevimab, bamlanivimab/etesevimab, or sotrovimab.  Has a (+) direct SARS-CoV-2 viral test result  Has mild or moderate COVID-19   Is NOT hospitalized due to COVID-19  Is within 10 days of symptom onset  Has at least one of the high risk factor(s) for progression to severe COVID-19 and/or hospitalization as defined in EUA.  Specific high risk criteria : BMI > 25 and Chronic Lung Disease   I have spoken and communicated the following to the patient or parent/caregiver regarding COVID monoclonal antibody treatment:  1. FDA has authorized the emergency use for the treatment of mild to moderate COVID-19 in adults and pediatric patients with positive results of direct SARS-CoV-2 viral testing who are 51 years of age and older weighing at least 40 kg, and who are at high risk for progressing to severe COVID-19 and/or hospitalization.  2. The significant known and potential risks and benefits of COVID monoclonal antibody, and the extent to which such potential risks and benefits are unknown.  3. Information on available alternative treatments and the risks and benefits of those alternatives, including clinical trials.  4. Patients treated with COVID monoclonal antibody should continue to self-isolate and use infection control measures (e.g., wear mask, isolate, social distance, avoid sharing personal items, clean and disinfect "high touch" surfaces, and frequent handwashing) according to CDC guidelines.   5. The patient or parent/caregiver has the option to accept or refuse COVID monoclonal antibody treatment.  After reviewing this information with the patient, the patient  has agreed to receive one of the available covid 19 monoclonal antibodies and will be provided an appropriate fact sheet prior to infusion. Mayra Reel, NP 12/19/2020 11:44 AM

## 2020-12-20 ENCOUNTER — Ambulatory Visit (HOSPITAL_COMMUNITY)
Admission: RE | Admit: 2020-12-20 | Discharge: 2020-12-20 | Disposition: A | Discharge status: Home / Self Care | Payer: BC Managed Care – PPO | Source: Ambulatory Visit | Attending: Pulmonary Disease | Admitting: Pulmonary Disease

## 2020-12-20 DIAGNOSIS — J449 Chronic obstructive pulmonary disease, unspecified: Secondary | ICD-10-CM

## 2020-12-20 DIAGNOSIS — Z87891 Personal history of nicotine dependence: Secondary | ICD-10-CM

## 2020-12-20 DIAGNOSIS — U071 COVID-19: Secondary | ICD-10-CM | POA: Diagnosis present

## 2020-12-20 MED ORDER — ALBUTEROL SULFATE HFA 108 (90 BASE) MCG/ACT IN AERS: 2.0000 | INHALATION_SPRAY | Freq: Once | RESPIRATORY_TRACT | Status: DC | PRN

## 2020-12-20 MED ORDER — FAMOTIDINE IN NACL 20-0.9 MG/50ML-% IV SOLN: 20.0000 mg | Freq: Once | INTRAVENOUS | Status: DC | PRN

## 2020-12-20 MED ORDER — SODIUM CHLORIDE 0.9 % IV SOLN: INTRAVENOUS | Status: DC | PRN

## 2020-12-20 MED ORDER — EPINEPHRINE 0.3 MG/0.3ML IJ SOAJ: 0.3000 mg | Freq: Once | INTRAMUSCULAR | Status: DC | PRN

## 2020-12-20 MED ORDER — SOTROVIMAB 500 MG/8ML IV SOLN
500.0000 mg | Freq: Once | INTRAVENOUS | Status: AC
  Administered 2020-12-20: 500 mg via INTRAVENOUS

## 2020-12-20 MED ORDER — METHYLPREDNISOLONE SODIUM SUCC 125 MG IJ SOLR: 125.0000 mg | Freq: Once | INTRAMUSCULAR | Status: DC | PRN

## 2020-12-20 MED ORDER — DIPHENHYDRAMINE HCL 50 MG/ML IJ SOLN: 50.0000 mg | Freq: Once | INTRAMUSCULAR | Status: DC | PRN

## 2020-12-20 NOTE — Discharge Instructions (Signed)
10 Things You Can Do to Manage Your COVID-19 Symptoms at Home If you have possible or confirmed COVID-19: 1. Stay home from work and school. And stay away from other public places. If you must go out, avoid using any kind of public transportation, ridesharing, or taxis. 2. Monitor your symptoms carefully. If your symptoms get worse, call your healthcare provider immediately. 3. Get rest and stay hydrated. 4. If you have a medical appointment, call the healthcare provider ahead of time and tell them that you have or may have COVID-19. 5. For medical emergencies, call 911 and notify the dispatch personnel that you have or may have COVID-19. 6. Cover your cough and sneezes with a tissue or use the inside of your elbow. 7. Wash your hands often with soap and water for at least 20 seconds or clean your hands with an alcohol-based hand sanitizer that contains at least 60% alcohol. 8. As much as possible, stay in a specific room and away from other people in your home. Also, you should use a separate bathroom, if available. If you need to be around other people in or outside of the home, wear a mask. 9. Avoid sharing personal items with other people in your household, like dishes, towels, and bedding. 10. Clean all surfaces that are touched often, like counters, tabletops, and doorknobs. Use household cleaning sprays or wipes according to the label instructions. cdc.gov/coronavirus 06/15/2019 This information is not intended to replace advice given to you by your health care provider. Make sure you discuss any questions you have with your health care provider. Document Revised: 11/17/2019 Document Reviewed: 11/17/2019 Elsevier Patient Education  2020 Elsevier Inc. What types of side effects do monoclonal antibody drugs cause?  Common side effects  In general, the more common side effects caused by monoclonal antibody drugs include: . Allergic reactions, such as hives or itching . Flu-like signs and  symptoms, including chills, fatigue, fever, and muscle aches and pains . Nausea, vomiting . Diarrhea . Skin rashes . Low blood pressure   The CDC is recommending patients who receive monoclonal antibody treatments wait at least 90 days before being vaccinated.  Currently, there are no data on the safety and efficacy of mRNA COVID-19 vaccines in persons who received monoclonal antibodies or convalescent plasma as part of COVID-19 treatment. Based on the estimated half-life of such therapies as well as evidence suggesting that reinfection is uncommon in the 90 days after initial infection, vaccination should be deferred for at least 90 days, as a precautionary measure until additional information becomes available, to avoid interference of the antibody treatment with vaccine-induced immune responses. If you have any questions or concerns after the infusion please call the Advanced Practice Provider on call at [phone]. This number is ONLY intended for your use regarding questions or concerns about the infusion post-treatment side-effects.  Please do not provide this number to others for use. For return to work notes please contact your primary care provider.   If someone you know is interested in receiving treatment please have them call the COVID hotline at [phone].   

## 2020-12-20 NOTE — Progress Notes (Signed)
Diagnosis: COVID-19  Physician: Dr. Patrick Wright  Procedure: [facility] Infusion Clinic Med: Sotrovimab infusion - Provided patient with sotrovimab fact sheet for patients, parents, and caregivers prior to infusion.   Complications: No immediate complications noted  Discharge: Discharged home    

## 2020-12-20 NOTE — Progress Notes (Signed)
Patient reviewed Fact Sheet for Patients, Parents, and Caregivers for Emergency Use Authorization (EUA) of sotrovimab for the Treatment of Coronavirus. Patient also reviewed and is agreeable to the estimated cost of treatment. Patient is agreeable to proceed.   

## 2020-12-21 ENCOUNTER — Other Ambulatory Visit (HOSPITAL_COMMUNITY): Payer: Self-pay

## 2020-12-24 ENCOUNTER — Other Ambulatory Visit: Payer: Self-pay

## 2020-12-24 ENCOUNTER — Ambulatory Visit
Admission: RE | Admit: 2020-12-24 | Discharge: 2020-12-24 | Disposition: A | Discharge status: Home / Self Care | Payer: BC Managed Care – PPO | Source: Ambulatory Visit | Attending: Oncology | Admitting: Oncology

## 2020-12-24 DIAGNOSIS — Z122 Encounter for screening for malignant neoplasm of respiratory organs: Secondary | ICD-10-CM | POA: Diagnosis present

## 2020-12-24 DIAGNOSIS — Z87891 Personal history of nicotine dependence: Secondary | ICD-10-CM

## 2020-12-25 NOTE — Progress Notes (Signed)
Bridgett Larsson, NP 12/25/2020 7:50 AM

## 2020-12-31 ENCOUNTER — Telehealth: Payer: Self-pay | Admitting: *Deleted

## 2020-12-31 NOTE — Telephone Encounter (Signed)
Notified patient of LDCT lung cancer screening program results with recommendation for 3 month follow up imaging. Also notified of incidental findings noted below and is encouraged to discuss further with PCP who will receive a copy of this note and/or the CT report. Patient verbalizes understanding. Patient reports Covid infection and has already reviewed results with her PCP.    IMPRESSION: Lung-RADS 4A, suspicious. Numerous new patchy clustered indistinct predominantly solid pulmonary nodules throughout the posterior bilateral lower lobes and posterior right upper lobe, largest 9.1 mm in volume derived mean diameter. Favor bronchopneumonia or aspiration. Follow up low-dose chest CT without contrast in 3 months (please use the following order, "CT CHEST LCS NODULE FOLLOW-UP W/O CM") is recommended.  Three-vessel coronary atherosclerosis.  Aortic Atherosclerosis (ICD10-I70.0) and Emphysema (ICD10-J43.9).

## 2021-02-20 ENCOUNTER — Other Ambulatory Visit: Payer: Self-pay

## 2021-02-20 ENCOUNTER — Encounter: Payer: Self-pay | Admitting: Primary Care

## 2021-02-20 ENCOUNTER — Ambulatory Visit: Payer: BC Managed Care – PPO | Admitting: Primary Care

## 2021-02-20 DIAGNOSIS — J449 Chronic obstructive pulmonary disease, unspecified: Secondary | ICD-10-CM

## 2021-02-20 DIAGNOSIS — G4733 Obstructive sleep apnea (adult) (pediatric): Secondary | ICD-10-CM | POA: Diagnosis not present

## 2021-02-20 DIAGNOSIS — F17200 Nicotine dependence, unspecified, uncomplicated: Secondary | ICD-10-CM | POA: Diagnosis not present

## 2021-02-20 MED ORDER — SPIRIVA HANDIHALER 18 MCG IN CAPS: 18.0000 ug | ORAL_CAPSULE | Freq: Every day | RESPIRATORY_TRACT | 11 refills | Status: DC

## 2021-02-20 NOTE — Progress Notes (Signed)
@Patient  ID: Emily Figueroa, female    DOB: June 12, 1956, 65 y.o.   MRN: [id number]  Chief Complaint  Patient presents with  . Follow-up    C/o sob with exertion and prod cough with clear to yellow sputum in the am.     Referring provider: Laneta Simmers, NP  HPI: 65 year old female, current every day smoker. PMH significant for COPD, emphysema. Patient of Dr. Mortimer Fries, last seen on 09/30/19. Intolerant to Symbicort. She has hx moderate OSA but was not started on CPAP d/t cost.   02/20/2021 Patient presents today for overdue follow-up. She experiences shortness of breath on exertion and has a productive cough with clear to yellow sputum in the morning. Feels Spiriva does help her breathing. She uses albuterol 1-3 times a week, mostly at work. She works Designer, television/film set which involves physical labor. She wear oxygen at night. She had LDCT on 12/24/20 that showed lung RADS 4a. She has covid in January 2022.  Smoking 2 cigarettes a day. She is trying to quit. PFTs in November 2020 showed evidence of airtrapping and hyperinflation. Denies f/c/s, chest tightness, chest pain, hemoptysis.    Allergies  Allergen Reactions  . Spiriva Handihaler [Tiotropium Bromide Monohydrate] Hives  . Symbicort [Budesonide-Formoterol Fumarate] Hives    Immunization History  Administered Date(s) Administered  . 19-influenza Whole 08/08/2019  . Hep A / Hep B 10/18/2015, 11/19/2015, 04/21/2016  . Influenza-Unspecified 10/15/2020    Past Medical History:  Diagnosis Date  . Abdominal pain, generalized   . Arthritis   . Bell's palsy   . Chicken pox   . COPD (chronic obstructive pulmonary disease) ([place])   . Diarrhea   . Dysphagia   . GERD (gastroesophageal reflux disease)   . Hypertension   . Weight loss     Tobacco History: Social History   Tobacco Use  Smoking Status Current Every Day Smoker  . Packs/day: 1.50  . Years: 47.00  . Pack years: 70.50  . Types: Cigarettes  Smokeless Tobacco Never Used   Tobacco Comment   2 cigs daily--02/20/2021   Ready to quit: Not Answered Counseling given: Not Answered Comment: 2 cigs daily--02/20/2021   Outpatient Medications Prior to Visit  Medication Sig Dispense Refill  . albuterol (VENTOLIN HFA) 108 (90 Base) MCG/ACT inhaler Inhale 2 puffs into the lungs every 4 (four) hours as needed for wheezing or shortness of breath. 1 g 6  . fluticasone (FLONASE) 50 MCG/ACT nasal spray     . gabapentin (NEURONTIN) 100 MG capsule     . montelukast (SINGULAIR) 10 MG tablet montelukast 10 mg tablet    . omeprazole (PRILOSEC) 20 MG capsule Take 20 mg by mouth daily.    Marland Kitchen tiotropium (SPIRIVA HANDIHALER) 18 MCG inhalation capsule Place 1 capsule (18 mcg total) into inhaler and inhale daily. 30 capsule 0   No facility-administered medications prior to visit.   Review of Systems  Review of Systems  Constitutional: Negative.   Respiratory: Positive for cough and shortness of breath. Negative for chest tightness and wheezing.    Physical Exam  BP 130/78 (BP Location: Left Arm, Cuff Size: Normal)   Pulse 94   Temp (!) 97.1 F (36.2 C) (Temporal)   Ht 5' (1.524 m)   Wt 128 lb 6.4 oz (58.2 kg)   SpO2 98%   BMI 25.08 kg/m  Physical Exam Constitutional:      Appearance: Normal appearance.  Cardiovascular:     Rate and Rhythm: Normal rate and regular rhythm.  Pulmonary:     Effort: Pulmonary effort is normal.     Breath sounds: Normal breath sounds.  Neurological:     General: No focal deficit present.     Mental Status: She is alert and oriented to person, place, and time. Mental status is at baseline.  Psychiatric:        Mood and Affect: Mood normal.        Behavior: Behavior normal.        Thought Content: Thought content normal.        Judgment: Judgment normal.      Lab Results:  CBC    Component Value Date/Time   WBC 10.4 01/10/2015 0006   RBC 4.48 01/10/2015 0006   HGB 13.1 01/10/2015 0006   HCT 38.8 01/10/2015 0006   PLT 220  01/10/2015 0006   MCV 87 01/10/2015 0006   MCH 29.2 01/10/2015 0006   MCHC 33.6 01/10/2015 0006   RDW 12.9 01/10/2015 0006   LYMPHSABS 1.4 01/10/2015 0006   MONOABS 0.7 01/10/2015 0006   EOSABS 0.2 01/10/2015 0006   BASOSABS 0.0 01/10/2015 0006    BMET    Component Value Date/Time   NA 142 01/10/2015 0006   K 3.9 01/10/2015 0006   CL 108 (H) 01/10/2015 0006   CO2 28 01/10/2015 0006   GLUCOSE 99 01/10/2015 0006   BUN 8 01/10/2015 0006   CREATININE 0.78 01/10/2015 0006   CALCIUM 8.8 01/10/2015 0006   GFRNONAA >60 01/10/2015 0006   GFRNONAA >60 04/09/2012 1125   GFRAA >60 01/10/2015 0006   GFRAA >60 04/09/2012 1125    BNP No results found for: BNP  ProBNP No results found for: PROBNP  Imaging: DG Cervical Spine Complete  Result Date: 03/21/2021 CLINICAL DATA:  Cervicalgia. EXAM: CERVICAL SPINE - COMPLETE 4+ VIEW COMPARISON:  None. FINDINGS: There is 2 mm anterolisthesis of C3 on C4 and 3 mm anterolisthesis of C4 on C5. Trace retrolisthesis of C5 on C6. Multilevel facet hypertrophy, more prominent on the left. Lateral masses of C1 are well aligned on C2. Disc space narrowing and endplate spurring at T0-Z6 and to a lesser extent C4-C5. There is bilateral C5-C6 bony neural foraminal stenosis, with C4-C5 neural foraminal stenosis on the left. No evidence of fracture or focal bone lesion. No prevertebral soft tissue edema. IMPRESSION: 1. Multilevel degenerative disc disease and facet hypertrophy in the cervical spine with degenerative anterolisthesis of C3 on C4 and C4 on C5. 2. Bilateral C5-C6 bony neural foraminal stenosis, as well as left C4-C5 foraminal stenosis. Electronically Signed   By: Keith Rake M.D.   On: 03/21/2021 16:33   DG Lumbar Spine Complete  Result Date: 03/21/2021 CLINICAL DATA:  Low back pain since March 27th.  No known injury. EXAM: LUMBAR SPINE - COMPLETE 4+ VIEW COMPARISON:  Lumbar radiograph 07/05/2012. FINDINGS: The bones are subjectively under  mineralized. There are 5 non-rib-bearing lumbar vertebra. Grade 1 anterolisthesis of L5 on S1 is new from prior exam. There is minimal endplate spurring throughout with preservation of disc spaces. Mild facet hypertrophy at L5-S1. Vertebral body heights are preserved. No evidence of fracture, focal lesion or bone destruction. The sacroiliac joints are congruent. IMPRESSION: Grade 1 anterolisthesis of L5 on S1, likely facet mediated with facet hypertrophy. Minimal endplate spurring throughout the lumbar spine without loss of disc height. Electronically Signed   By: Keith Rake M.D.   On: 03/21/2021 16:31     Assessment & Plan:   COPD (chronic obstructive pulmonary disease) (  [place]) - Stable interval; Well controlled present therapy. Experiences shortness of breath with physical labor. Reports improvement in breathing with Spriva. Morning cough with sputum production.  - PFTs in November 2020 showed evidence of airtrapping and hyperinflation - Continue Spiriva Handihaler 62mcg once day in morning; albuterol hfa 2 puffs every 6 hours for breakthrough shortness of breath/wheezing  - Appears she is overdue for pneumococcal vaccine, please discuss with patient at next visit  - Strongly encourage smoking cessation  - FU in 6 months   Current smoker - LDCT on 12/24/20 showed lung RADS 4a. She has covid in January 2022 - Due for 3 month follow-up low dose CT chest in April 2022   OSA (obstructive sleep apnea) - Hx moderate OSA, CPAP not started due to cost  - ONO on 10/05/19 showed patient spent 17 mins with SpO2 <88% RA - Continue 1L supplemental oxygen at night    Martyn Ehrich, NP 04/02/2021

## 2021-02-20 NOTE — Patient Instructions (Addendum)
  Recommendations: Continue Spiriva 2 puff once day in morning Use albuterol 2 puffs every 6 hours for breakthrough shortness of breath You are due for 3 month follow-up low dose CT chest in April 2022  Please call our office if you get Cpap   Follow-up: 6 months with Dr. Mortimer Fries or sooner if needed

## 2021-03-08 ENCOUNTER — Telehealth: Payer: Self-pay | Admitting: *Deleted

## 2021-03-08 NOTE — Telephone Encounter (Signed)
Contacted patient in attempt to schedule 3 month follow up scan from Lung Rads 4 finding on 12/24/20. Patient is made aware of rationale for the follow up scan and the risk of waiting until later to follow up.   She adamantly refuses to have follow up scan at this time. She has my contact information and knows to call me if she changes her mind or for any questions.

## 2021-03-11 NOTE — Telephone Encounter (Signed)
Noted, thank you

## 2021-03-20 ENCOUNTER — Other Ambulatory Visit: Payer: Self-pay | Admitting: Physician Assistant

## 2021-03-20 ENCOUNTER — Ambulatory Visit
Admission: RE | Admit: 2021-03-20 | Discharge: 2021-03-20 | Disposition: A | Discharge status: Home / Self Care | Payer: BC Managed Care – PPO | Attending: Physician Assistant | Admitting: Physician Assistant

## 2021-03-20 ENCOUNTER — Ambulatory Visit
Admission: RE | Admit: 2021-03-20 | Discharge: 2021-03-20 | Disposition: A | Discharge status: Home / Self Care | Payer: BC Managed Care – PPO | Source: Ambulatory Visit | Attending: Physician Assistant | Admitting: Physician Assistant

## 2021-03-20 ENCOUNTER — Other Ambulatory Visit: Payer: Self-pay

## 2021-03-20 DIAGNOSIS — M545 Low back pain, unspecified: Secondary | ICD-10-CM

## 2021-03-20 DIAGNOSIS — M542 Cervicalgia: Secondary | ICD-10-CM | POA: Diagnosis present

## 2021-03-22 ENCOUNTER — Other Ambulatory Visit: Payer: Self-pay | Admitting: Physician Assistant

## 2021-03-22 DIAGNOSIS — M542 Cervicalgia: Secondary | ICD-10-CM

## 2021-03-22 DIAGNOSIS — M545 Low back pain, unspecified: Secondary | ICD-10-CM

## 2021-04-02 ENCOUNTER — Encounter: Payer: Self-pay | Admitting: Primary Care

## 2021-04-02 DIAGNOSIS — G4733 Obstructive sleep apnea (adult) (pediatric): Secondary | ICD-10-CM | POA: Insufficient documentation

## 2021-04-02 DIAGNOSIS — J449 Chronic obstructive pulmonary disease, unspecified: Secondary | ICD-10-CM | POA: Insufficient documentation

## 2021-04-02 NOTE — Assessment & Plan Note (Addendum)
-   Stable interval; Well controlled present therapy. Experiences shortness of breath with physical labor. Reports improvement in breathing with Spriva. Morning cough with sputum production.  - PFTs in November 2020 showed evidence of airtrapping and hyperinflation - Continue Spiriva Handihaler 61mcg once day in morning; albuterol hfa 2 puffs every 6 hours for breakthrough shortness of breath/wheezing  - Appears she is overdue for pneumococcal vaccine, please discuss with patient at next visit  - Strongly encourage smoking cessation  - FU in 6 months

## 2021-04-02 NOTE — Assessment & Plan Note (Addendum)
-   LDCT on 12/24/20 showed lung RADS 4a. She has covid in January 2022 - Due for 3 month follow-up low dose CT chest in April 2022

## 2021-04-02 NOTE — Assessment & Plan Note (Addendum)
-   Hx moderate OSA, CPAP not started due to cost  - ONO on 10/05/19 showed patient spent 17 mins with SpO2 <88% RA - Continue 1L supplemental oxygen at night

## 2021-04-03 ENCOUNTER — Ambulatory Visit
Admission: RE | Admit: 2021-04-03 | Discharge: 2021-04-03 | Disposition: A | Discharge status: Home / Self Care | Payer: BC Managed Care – PPO | Source: Ambulatory Visit | Attending: Physician Assistant | Admitting: Physician Assistant

## 2021-04-03 ENCOUNTER — Other Ambulatory Visit: Payer: Self-pay

## 2021-04-03 DIAGNOSIS — M545 Low back pain, unspecified: Secondary | ICD-10-CM | POA: Insufficient documentation

## 2021-04-03 MED ORDER — GADOBUTROL 1 MMOL/ML IV SOLN
5.0000 mL | Freq: Once | INTRAVENOUS | Status: AC | PRN
  Administered 2021-04-03: 5 mL via INTRAVENOUS

## 2021-07-12 ENCOUNTER — Telehealth: Payer: Self-pay

## 2021-07-12 NOTE — Telephone Encounter (Signed)
[facility] referring for recurrent UTI anmd mixed incontinence. Paper referral. Called and left voicemail for patient to call back to be scheduled.

## 2021-07-15 ENCOUNTER — Telehealth: Payer: Self-pay

## 2021-07-15 NOTE — Telephone Encounter (Signed)
Called and left voicemail for patient to call back to be scheduled. 

## 2021-07-16 NOTE — Telephone Encounter (Signed)
error 

## 2021-07-31 ENCOUNTER — Encounter: Payer: Self-pay | Admitting: Obstetrics and Gynecology

## 2021-07-31 ENCOUNTER — Other Ambulatory Visit: Payer: Self-pay

## 2021-07-31 ENCOUNTER — Ambulatory Visit: Payer: BC Managed Care – PPO | Admitting: Obstetrics and Gynecology

## 2021-07-31 VITALS — BP 122/74 | Ht 60.0 in | Wt 124.6 lb

## 2021-07-31 DIAGNOSIS — R300 Dysuria: Secondary | ICD-10-CM

## 2021-07-31 DIAGNOSIS — R350 Frequency of micturition: Secondary | ICD-10-CM

## 2021-07-31 DIAGNOSIS — N905 Atrophy of vulva: Secondary | ICD-10-CM | POA: Diagnosis not present

## 2021-07-31 DIAGNOSIS — N390 Urinary tract infection, site not specified: Secondary | ICD-10-CM

## 2021-07-31 LAB — POCT URINALYSIS DIPSTICK OB
Bilirubin, UA: NEGATIVE
Blood, UA: NEGATIVE
Glucose, UA: NEGATIVE
Ketones, UA: NEGATIVE
Nitrite, UA: NEGATIVE
POC,PROTEIN,UA: NEGATIVE
Spec Grav, UA: 1.015 (ref 1.010–1.025)
Urobilinogen, UA: 0.2 E.U./dL
pH, UA: 5 (ref 5.0–8.0)

## 2021-07-31 MED ORDER — PREMARIN 0.625 MG/GM VA CREA: 1.0000 | TOPICAL_CREAM | Freq: Every day | VAGINAL | 12 refills | Status: DC

## 2021-07-31 MED ORDER — SOLIFENACIN SUCCINATE 5 MG PO TABS: 5.0000 mg | ORAL_TABLET | Freq: Every day | ORAL | 1 refills | Status: DC

## 2021-07-31 NOTE — Progress Notes (Signed)
Patient ID: Emily Figueroa, female   DOB: 06-11-1956, 65 y.o.   MRN: [id number]  Reason for Consult: Gynecologic Exam   Referred by Rutherford Limerick, PA  Subjective:     HPI:  Emily Figueroa is a 65 y.o. female she reports issues with recurrent urinary tract infections.  She is also been having issues with frequent trips to the restroom.  She reports that she generally has to urinate more than 7 times a day and more than 3 times at night.  Gynecological History  No LMP recorded. Patient is postmenopausal. Menopause: 46  Last Pap: Results were: 2020 NIL and HR HPV negative    She identifies as a female. She is not sexually active .      Past Medical History:  Diagnosis Date   Abdominal pain, generalized    Arthritis    Bell's palsy    Chicken pox    COPD (chronic obstructive pulmonary disease) ([facility])    Diarrhea    Dysphagia    GERD (gastroesophageal reflux disease)    Hypertension    Weight loss    Family History  Problem Relation Age of Onset   Leukemia Mother    Heart Problems Mother    Past Surgical History:  Procedure Laterality Date   COLONOSCOPY WITH PROPOFOL N/A 09/14/2015   Procedure: COLONOSCOPY WITH PROPOFOL;  Surgeon: Manya Silvas, MD;  Location: [place];  Service: Endoscopy;  Laterality: N/A;   DIAGNOSTIC LAPAROSCOPY     ESOPHAGOGASTRODUODENOSCOPY (EGD) WITH PROPOFOL N/A 09/14/2015   Procedure: ESOPHAGOGASTRODUODENOSCOPY (EGD) WITH PROPOFOL;  Surgeon: Manya Silvas, MD;  Location: [facility] ENDOSCOPY;  Service: Endoscopy;  Laterality: N/A;   SAVORY DILATION N/A 09/14/2015   Procedure: SAVORY DILATION;  Surgeon: Manya Silvas, MD;  Location: [facility] ENDOSCOPY;  Service: Endoscopy;  Laterality: N/A;   TEMPOROMANDIBULAR JOINT SURGERY      Short Social History:  Social History   Tobacco Use   Smoking status: Every Day    Packs/day: 1.50    Years: 47.00    Pack years: 70.50    Types: Cigarettes   Smokeless tobacco: Never   Tobacco  comments:    2 cigs daily--02/20/2021  Substance Use Topics   Alcohol use: No    Allergies  Allergen Reactions   Spiriva Handihaler [Tiotropium Bromide Monohydrate] Hives   Symbicort [Budesonide-Formoterol Fumarate] Hives    Current Outpatient Medications  Medication Sig Dispense Refill   albuterol (VENTOLIN HFA) 108 (90 Base) MCG/ACT inhaler Inhale 2 puffs into the lungs every 4 (four) hours as needed for wheezing or shortness of breath. 1 g 6   fluticasone (FLONASE) 50 MCG/ACT nasal spray      gabapentin (NEURONTIN) 100 MG capsule      montelukast (SINGULAIR) 10 MG tablet montelukast 10 mg tablet     omeprazole (PRILOSEC) 20 MG capsule Take 20 mg by mouth daily.     tiotropium (SPIRIVA HANDIHALER) 18 MCG inhalation capsule Place 1 capsule (18 mcg total) into inhaler and inhale daily. 30 capsule 11   No current facility-administered medications for this visit.    REVIEW OF SYSTEMS      Objective:  Objective   Vitals:   07/31/21 1418  BP: 128/70  Weight: 142 lb 6.4 oz (64.6 kg)  Height: '5\' 4"'$  (1.626 m)   Body mass index is 24.44 kg/m.  Physical Exam Vitals and nursing note reviewed. Exam conducted with a chaperone present.  Constitutional:      Appearance: Normal  appearance.  HENT:     Head: Normocephalic and atraumatic.  Eyes:     Extraocular Movements: Extraocular movements intact.     Pupils: Pupils are equal, round, and reactive to light.  Cardiovascular:     Rate and Rhythm: Normal rate and regular rhythm.  Pulmonary:     Effort: Pulmonary effort is normal.     Breath sounds: Normal breath sounds.  Abdominal:     General: Abdomen is flat.     Palpations: Abdomen is soft.  Musculoskeletal:     Cervical back: Normal range of motion.  Skin:    General: Skin is warm and dry.  Neurological:     General: No focal deficit present.     Mental Status: She is alert and oriented to person, place, and time.  Psychiatric:        Behavior: Behavior normal.         Thought Content: Thought content normal.        Judgment: Judgment normal.    Assessment/Plan:     65 year old with recurrent urinary tract infections Vaginal atrophy : will start start topical Premarin ointment. Urinary frequency: We will start Vesicare.  Follow-up to assess for improvement in 4-8 weeks.  More than 20 minutes were spent face to face with the patient in the room, reviewing the medical record, labs and images, and coordinating care for the patient. The plan of management was discussed in detail and counseling was provided.      Adrian Prows MD [facility] 07/31/2021 2:28 PM

## 2021-07-31 NOTE — Patient Instructions (Signed)
Urinary Tract Infection, Adult A urinary tract infection (UTI) is an infection of any part of the urinary tract. The urinary tract includes the kidneys, ureters, bladder, and urethra. These organs make, store, and get rid of urine in the body. An upper UTI affects the ureters and kidneys. A lower UTI affects the bladder and urethra. What are the causes? Most urinary tract infections are caused by bacteria in your genital area around your urethra, where urine leaves your body. These bacteria grow and cause inflammation of your urinary tract. What increases the risk? You are more likely to develop this condition if: You have a urinary catheter that stays in place. You are not able to control when you urinate or have a bowel movement (incontinence). You are female and you: Use a spermicide or diaphragm for birth control. Have low estrogen levels. Are pregnant. You have certain genes that increase your risk. You are sexually active. You take antibiotic medicines. You have a condition that causes your flow of urine to slow down, such as: An enlarged prostate, if you are female. Blockage in your urethra. A kidney stone. A nerve condition that affects your bladder control (neurogenic bladder). Not getting enough to drink, or not urinating often. You have certain medical conditions, such as: Diabetes. A weak disease-fighting system (immunesystem). Sickle cell disease. Gout. Spinal cord injury. What are the signs or symptoms? Symptoms of this condition include: Needing to urinate right away (urgency). Frequent urination. This may include small amounts of urine each time you urinate. Pain or burning with urination. Blood in the urine. Urine that smells bad or unusual. Trouble urinating. Cloudy urine. Vaginal discharge, if you are female. Pain in the abdomen or the lower back. You may also have: Vomiting or a decreased appetite. Confusion. Irritability or tiredness. A fever or  chills. Diarrhea. The first symptom in older adults may be confusion. In some cases, they may not have any symptoms until the infection has worsened. How is this diagnosed? This condition is diagnosed based on your medical history and a physical exam. You may also have other tests, including: Urine tests. Blood tests. Tests for STIs (sexually transmitted infections). If you have had more than one UTI, a cystoscopy or imaging studies may be done to determine the cause of the infections. How is this treated? Treatment for this condition includes: Antibiotic medicine. Over-the-counter medicines to treat discomfort. Drinking enough water to stay hydrated. If you have frequent infections or have other conditions such as a kidney stone, you may need to see a health care provider who specializes in the urinary tract (urologist). In rare cases, urinary tract infections can cause sepsis. Sepsis is a life-threatening condition that occurs when the body responds to an infection. Sepsis is treated in the hospital with IV antibiotics, fluids, and other medicines. Follow these instructions at home: Medicines Take over-the-counter and prescription medicines only as told by your health care provider. If you were prescribed an antibiotic medicine, take it as told by your health care provider. Do not stop using the antibiotic even if you start to feel better. General instructions Make sure you: Empty your bladder often and completely. Do not hold urine for long periods of time. Empty your bladder after sex. Wipe from front to back after urinating or having a bowel movement if you are female. Use each tissue only one time when you wipe. Drink enough fluid to keep your urine pale yellow. Keep all follow-up visits. This is important. Contact a health care provider   if: Your symptoms do not get better after 1-2 days. Your symptoms go away and then return. Get help right away if: You have severe pain in your  back or your lower abdomen. You have a fever or chills. You have nausea or vomiting. Summary A urinary tract infection (UTI) is an infection of any part of the urinary tract, which includes the kidneys, ureters, bladder, and urethra. Most urinary tract infections are caused by bacteria in your genital area. Treatment for this condition often includes antibiotic medicines. If you were prescribed an antibiotic medicine, take it as told by your health care provider. Do not stop using the antibiotic even if you start to feel better. Keep all follow-up visits. This is important. This information is not intended to replace advice given to you by your health care provider. Make sure you discuss any questions you have with your health care provider. Document Revised: 07/13/2020 Document Reviewed: 07/13/2020 Elsevier Patient Education  2022 [employer] Inc.  

## 2021-09-18 ENCOUNTER — Other Ambulatory Visit: Payer: Self-pay

## 2021-09-18 ENCOUNTER — Encounter: Payer: Self-pay | Admitting: Internal Medicine

## 2021-09-18 ENCOUNTER — Ambulatory Visit: Payer: BC Managed Care – PPO | Admitting: Internal Medicine

## 2021-09-18 VITALS — BP 132/70 | HR 92 | Temp 98.2°F | Ht 60.0 in | Wt 128.0 lb

## 2021-09-18 DIAGNOSIS — Z720 Tobacco use: Secondary | ICD-10-CM

## 2021-09-18 DIAGNOSIS — J9611 Chronic respiratory failure with hypoxia: Secondary | ICD-10-CM | POA: Diagnosis not present

## 2021-09-18 DIAGNOSIS — F17210 Nicotine dependence, cigarettes, uncomplicated: Secondary | ICD-10-CM

## 2021-09-18 DIAGNOSIS — J449 Chronic obstructive pulmonary disease, unspecified: Secondary | ICD-10-CM

## 2021-09-18 MED ORDER — SPIRIVA RESPIMAT 2.5 MCG/ACT IN AERS
2.0000 | INHALATION_SPRAY | Freq: Every day | RESPIRATORY_TRACT | 0 refills | Status: DC
Start: 2021-09-18 — End: 2022-04-28

## 2021-09-18 NOTE — Addendum Note (Signed)
Addended by: Fenton Foy on: 09/18/2021 10:45 AM   Modules accepted: Orders

## 2021-09-18 NOTE — Patient Instructions (Addendum)
Try Spiriva Respimat 2.5 Albuterol as needed and prior to exertion Avoid secondhand smoke exposure Continue lung cancer screening protocol Continue oxygen as needed Please stop smoking

## 2021-09-18 NOTE — Progress Notes (Signed)
[place] [facility] Consultation    SYNOPSIS  The patient is a 65 year old female with history of COPD.  He has had a possible allergic reaction to Symbicort in the past, asked to maintain on Spiriva.  Patient also has a history of OSA but did not start CPAP due to cost.  She is using spiriva once daily. She is still smoking about 5 per day, and is trying to quit, and she thinks that Dec. 31st is her quit date. She has been on chantix twice in the past and feels that it helped but does not want to try it again. She has smoked 1-1.5 ppd, started smoking at age of 20.  She is not interested in a referral to a dentist for possible dental device.   She has a history of TMJ.    Desat walk 01/01/18; on RA at rest her sat is 98% and HR 95. She walked 360 feet at brisk pace, moderate dyspena, Sat 95% and HR 101.  Imaging personally reviewed, chest x-ray 09/17/17, hyperinflation consistent with emphysema.  CT chest 04/09/12 moderate to severe changes of apical emphysema  Date: 09/18/2021  MRN# [id number] Emily Figueroa 1956-03-15    Emily Figueroa is a 65 y.o. old female seen in consultation for chief complaint of:  CC Follow up COPD  HPI:     No exacerbation at this time No evidence of heart failure at this time No evidence or signs of infection at this time No respiratory distress No fevers, chills, nausea, vomiting, diarrhea No evidence of lower extremity edema No evidence hemoptysis    Smoking Assessment and Cessation Counseling Upon further questioning, Patient smokes 1 ppd I have advised patient to quit/stop smoking as soon as possible due to high risk for multiple medical problems  Patientis NOT willing to quit smoking  I have advised patient that we can assist and have options of Nicotine replacement therapy. I also advised patient on behavioral therapy and can provide oral medication therapy in conjunction with the other therapies Follow up next Office visit  for  assessment of smoking cessation Smoking cessation counseling advised for 4 minutes   Patient uses oxygen as needed at nighttime Continue as prescribed  Review of Systems:  Gen:  Denies  fever, sweats, chills weight loss  HEENT: Denies blurred vision, double vision, ear pain, eye pain, hearing loss, nose bleeds, sore throat Cardiac:  No dizziness, chest pain or heaviness, chest tightness,edema, No JVD Resp:   No cough, -sputum production, -shortness of breath,-wheezing, -hemoptysis,  Other:  All other systems negative    BP 132/70 (BP Location: Left Arm, Patient Position: Sitting, Cuff Size: Normal)   Pulse 92   Temp 98.2 F (36.8 C)   Ht 5' (1.524 m)   Wt 128 lb (58.1 kg)   SpO2 98%   BMI 25.00 kg/m   Physical Examination:   General Appearance: No distress  EYES PERRLA, EOM intact.   NECK Supple, No JVD Pulmonary: normal breath sounds, No wheezing.   ALL OTHER ROS ARE NEGATIVE        Social Hx:   Social History   Tobacco Use   Smoking status: Every Day    Packs/day: 1.50    Years: 47.00    Pack years: 70.50    Types: Cigarettes   Smokeless tobacco: Never   Tobacco comments:    2 cigs daily--02/20/2021  Vaping Use   Vaping Use: Never used  Substance Use Topics   Alcohol use: No  Drug use: No   Medication:    Current Outpatient Medications:    albuterol (VENTOLIN HFA) 108 (90 Base) MCG/ACT inhaler, Inhale 2 puffs into the lungs every 4 (four) hours as needed for wheezing or shortness of breath., Disp: 1 g, Rfl: 6   conjugated estrogens (PREMARIN) vaginal cream, Place 1 Applicatorful vaginally daily., Disp: 42.5 g, Rfl: 12   fluticasone (FLONASE) 50 MCG/ACT nasal spray, , Disp: , Rfl:    gabapentin (NEURONTIN) 100 MG capsule, , Disp: , Rfl:    montelukast (SINGULAIR) 10 MG tablet, montelukast 10 mg tablet, Disp: , Rfl:    omeprazole (PRILOSEC) 20 MG capsule, Take 20 mg by mouth daily., Disp: , Rfl:    solifenacin (VESICARE) 5 MG tablet, Take 1 tablet (5  mg total) by mouth daily., Disp: 30 tablet, Rfl: 1   tiotropium (SPIRIVA HANDIHALER) 18 MCG inhalation capsule, Place 1 capsule (18 mcg total) into inhaler and inhale daily., Disp: 30 capsule, Rfl: 11   Allergies:  Spiriva handihaler [tiotropium bromide monohydrate] and Symbicort [budesonide-formoterol fumarate]    Assessment and Plan:  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 and progressive shortness of breath Continue inhalers as prescribed Lets stop Spiriva HandiHaler) Spiriva Respimat 2.5  Butyryl as needed  Chronic hypoxic respiratory failure Continue oxygen as prescribed  Lung cancer screening protocol Continue as scheduled  Please stop smoking   MEDICATION ADJUSTMENTS/LABS AND TESTS ORDERED: Try Spiriva Respimat 2.5 Albuterol as needed and prior to exertion Avoid secondhand smoke exposure Continue lung cancer screening protocol Continue oxygen as needed Please stop smoking   CURRENT MEDICATIONS REVIEWED AT LENGTH WITH PATIENT TODAY   Patient satisfied with Plan of action and management. All questions answered  Follow-up 1 year  Total time spent 22 minutes   Aslin Farinas Patricia Pesa, M.D.  [facility]  Medical Director [facility] Director [facility] Cardio-Pulmonary Department

## 2021-12-17 ENCOUNTER — Other Ambulatory Visit: Payer: Self-pay

## 2021-12-17 ENCOUNTER — Ambulatory Visit (INDEPENDENT_AMBULATORY_CARE_PROVIDER_SITE_OTHER): Payer: BC Managed Care – PPO

## 2021-12-17 ENCOUNTER — Ambulatory Visit
Admission: EM | Admit: 2021-12-17 | Discharge: 2021-12-17 | Disposition: A | Discharge status: Home / Self Care | Payer: BC Managed Care – PPO | Attending: Family Medicine | Admitting: Family Medicine

## 2021-12-17 ENCOUNTER — Encounter: Payer: Self-pay | Admitting: Emergency Medicine

## 2021-12-17 DIAGNOSIS — R0602 Shortness of breath: Secondary | ICD-10-CM

## 2021-12-17 DIAGNOSIS — J441 Chronic obstructive pulmonary disease with (acute) exacerbation: Secondary | ICD-10-CM

## 2021-12-17 LAB — POCT INFLUENZA A/B
Influenza A, POC: NEGATIVE
Influenza B, POC: NEGATIVE

## 2021-12-17 MED ORDER — PROMETHAZINE-DM 6.25-15 MG/5ML PO SYRP: 5.0000 mL | ORAL_SOLUTION | Freq: Three times a day (TID) | ORAL | 0 refills | Status: DC | PRN

## 2021-12-17 MED ORDER — PREDNISONE 20 MG PO TABS
40.0000 mg | ORAL_TABLET | Freq: Every day | ORAL | 0 refills | Status: DC
Start: 2021-12-17 — End: 2022-09-25

## 2021-12-17 MED ORDER — DOXYCYCLINE HYCLATE 100 MG PO CAPS: 100.0000 mg | ORAL_CAPSULE | Freq: Two times a day (BID) | ORAL | 0 refills | Status: DC

## 2021-12-17 MED ORDER — DEXAMETHASONE SODIUM PHOSPHATE 10 MG/ML IJ SOLN
10.0000 mg | INTRAMUSCULAR | Status: AC
  Administered 2021-12-17: 10 mg via INTRAMUSCULAR

## 2021-12-17 NOTE — ED Triage Notes (Addendum)
Pt c/o HA, sinus pressure, congestion, fever,cough, chest congestion. Pt has COPD and states she has chest pain as well. Pt took a covid test at [facility]

## 2021-12-17 NOTE — ED Provider Notes (Signed)
[facility]    CSN: [id number] Arrival date & time: 12/17/21  1024      History   Chief Complaint Chief Complaint  Patient presents with   Fever   Headache    HPI Emily Figueroa is a 66 y.o. female.   HPI Patient presents today with headache, congestions, fever, cough, chest tightness, and shortness of breath. Took a PCR COVID test yesterday which has resulted while here at urgent care and results are negative. Patient suffers from COPD and is current smoker. Endorses wheezing and has been using inhaler more frequently. Past history of pneumonia. Afebrile at present.   Past Medical History:  Diagnosis Date   Abdominal pain, generalized    Arthritis    Bell's palsy    Chicken pox    COPD (chronic obstructive pulmonary disease) (HCC)    Diarrhea    Dysphagia    GERD (gastroesophageal reflux disease)    Hypertension    Weight loss     Patient Active Problem List   Diagnosis Date Noted   COPD (chronic obstructive pulmonary disease) ([place]) 04/02/2021   OSA (obstructive sleep apnea) 04/02/2021   Current smoker 12/10/2018   Acquired trigger finger 01/01/2018   Abnormal weight loss 06/26/2015   Chronic heartburn 06/26/2015   Dysphagia, unspecified 06/26/2015   Generalized abdominal pain 06/26/2015    Past Surgical History:  Procedure Laterality Date   COLONOSCOPY WITH PROPOFOL N/A 09/14/2015   Procedure: COLONOSCOPY WITH PROPOFOL;  Surgeon: Manya Silvas, MD;  Location: [place];  Service: Endoscopy;  Laterality: N/A;   DIAGNOSTIC LAPAROSCOPY     ESOPHAGOGASTRODUODENOSCOPY (EGD) WITH PROPOFOL N/A 09/14/2015   Procedure: ESOPHAGOGASTRODUODENOSCOPY (EGD) WITH PROPOFOL;  Surgeon: Manya Silvas, MD;  Location: [facility] ENDOSCOPY;  Service: Endoscopy;  Laterality: N/A;   SAVORY DILATION N/A 09/14/2015   Procedure: SAVORY DILATION;  Surgeon: Manya Silvas, MD;  Location: [facility] ENDOSCOPY;  Service: Endoscopy;  Laterality: N/A;   TEMPOROMANDIBULAR JOINT  SURGERY      OB History     Gravida  4   Para  3   Term  3   Preterm      AB  1   Living  3      SAB      IAB      Ectopic  1   Multiple      Live Births  3            Home Medications    Prior to Admission medications   Medication Sig Start Date End Date Taking? Authorizing Provider  doxycycline (VIBRAMYCIN) 100 MG capsule Take 1 capsule (100 mg total) by mouth 2 (two) times daily. 12/17/21  Yes Scot Jun, FNP  predniSONE (DELTASONE) 20 MG tablet Take 2 tablets (40 mg total) by mouth daily with breakfast. 12/17/21  Yes Scot Jun, FNP  promethazine-dextromethorphan (PROMETHAZINE-DM) 6.25-15 MG/5ML syrup Take 5 mLs by mouth 3 (three) times daily as needed for cough. 12/17/21  Yes Scot Jun, FNP  albuterol (VENTOLIN HFA) 108 (90 Base) MCG/ACT inhaler Inhale 2 puffs into the lungs every 4 (four) hours as needed for wheezing or shortness of breath. 09/30/19   Flora Lipps, MD  conjugated estrogens (PREMARIN) vaginal cream Place 1 Applicatorful vaginally daily. 07/31/21   Homero Fellers, MD  fluticasone (FLONASE) 50 MCG/ACT nasal spray  02/01/19   [provider]  gabapentin (NEURONTIN) 100 MG capsule  06/07/19   [provider]  montelukast (SINGULAIR) 10 MG  tablet montelukast 10 mg tablet    [provider]  omeprazole (PRILOSEC) 20 MG capsule Take 20 mg by mouth daily.    [provider]  solifenacin (VESICARE) 5 MG tablet Take 1 tablet (5 mg total) by mouth daily. 07/31/21   Schuman, Stefanie Libel, MD  tiotropium (SPIRIVA HANDIHALER) 18 MCG inhalation capsule Place 1 capsule (18 mcg total) into inhaler and inhale daily. 02/20/21 02/20/22  Martyn Ehrich, NP  Tiotropium Bromide Monohydrate (SPIRIVA RESPIMAT) 2.5 MCG/ACT AERS Inhale 2 puffs into the lungs daily. 09/18/21   Flora Lipps, MD    Family History Family History  Problem Relation Age of Onset   Leukemia Mother    Heart Problems Mother      Social History Social History   Tobacco Use   Smoking status: Every Day    Packs/day: 1.50    Years: 47.00    Pack years: 70.50    Types: Cigarettes   Smokeless tobacco: Never   Tobacco comments:    2 cigs daily--02/20/2021  Vaping Use   Vaping Use: Never used  Substance Use Topics   Alcohol use: No   Drug use: No     Allergies   Spiriva handihaler [tiotropium bromide monohydrate] and Symbicort [budesonide-formoterol fumarate]   Review of Systems Review of Systems   Physical Exam Triage Vital Signs ED Triage Vitals  Enc Vitals Group     BP 12/17/21 1258 (!) 147/83     Pulse Rate 12/17/21 1258 (!) 106     Resp 12/17/21 1258 18     Temp 12/17/21 1258 98.5 F (36.9 C)     Temp Source 12/17/21 1258 Oral     SpO2 12/17/21 1258 96 %     Weight --      Height --      Head Circumference --      Peak Flow --      Pain Score 12/17/21 1255 0     Pain Loc --      Pain Edu? --      Excl. in [place]? --    No data found.  Updated Vital Signs BP (!) 147/83 (BP Location: Left Arm)    Pulse (!) 106    Temp 98.5 F (36.9 C) (Oral)    Resp 18    SpO2 96%   Visual Acuity Right Eye Distance:   Left Eye Distance:   Bilateral Distance:    Right Eye Near:   Left Eye Near:    Bilateral Near:     Physical Exam Constitutional:      Appearance: She is well-developed. She is ill-appearing.  HENT:     Head: Normocephalic and atraumatic.     Nose: Congestion present.  Eyes:     Extraocular Movements: Extraocular movements intact.     Pupils: Pupils are equal, round, and reactive to light.  Cardiovascular:     Rate and Rhythm: Regular rhythm. Tachycardia present.  Pulmonary:     Breath sounds: Wheezing and rhonchi present.  Abdominal:     Palpations: Abdomen is soft.  Musculoskeletal:        General: Normal range of motion.     Cervical back: Normal range of motion.  Lymphadenopathy:     Cervical: No cervical adenopathy.  Skin:    General: Skin is warm and dry.      Capillary Refill: Capillary refill takes less than 2 seconds.  Neurological:     Mental Status: She is alert.  UC Treatments / Results  Labs (all labs ordered are listed, but only abnormal results are displayed) Labs Reviewed  POCT INFLUENZA A/B    EKG NSR 100 BPM, no ST Changes   Radiology DG Chest 2 View  Result Date: 12/17/2021 CLINICAL DATA:  Shortness of breath. EXAM: CHEST - 2 VIEW COMPARISON:  September 17, 2017. FINDINGS: The heart size and mediastinal contours are within normal limits. Mild hyperinflation of the lungs is noted. Mild interstitial densities are noted throughout both lungs which may represent chronic interstitial lung disease, although acute superimposed edema or atypical inflammation cannot be excluded. The visualized skeletal structures are unremarkable. IMPRESSION: Mild interstitial densities are noted throughout both lungs which may represent chronic interstitial lung disease, although acute superimposed edema or atypical inflammation cannot be excluded. Electronically Signed   By: Marijo Conception M.D.   On: 12/17/2021 13:55    Procedures Procedures (including critical care time)  Medications Ordered in UC Medications  dexamethasone (DECADRON) injection 10 mg (10 mg Intramuscular Given 12/17/21 1328)    Initial Impression / Assessment and Plan / UC Course  I have reviewed the triage vital signs and the nursing notes.  Pertinent labs & imaging results that were available during my care of the patient were reviewed by me and considered in my medical decision making (see chart for details).   COPD exacerbation with SOB Decadron 10 mg IM given in clinic.  Start oral prednisone 40 mg daily x 5 days Doxycyline 100 mg BID x 10 days. Resume home oxygen. Hydrate well with fluids to improve hydration status. ECG  Strict ER precautions if symptoms worsen. Final Clinical Impressions(s) / UC Diagnoses   Final diagnoses:  COPD exacerbation ([place])  Shortness of  breath     Discharge Instructions      Take medication as prescribed.  I will notify you once your chest x-ray has been reviewed by the radiologist if any additional medication is needed.  In the meantime start your doxycycline and prednisone.  Continue your home inhaler treatments.  Continue your home oxygen as needed.  If any of your symptoms worsen or become severe go immediately to the emergency department.    ED Prescriptions     Medication Sig Dispense Auth. Provider   predniSONE (DELTASONE) 20 MG tablet Take 2 tablets (40 mg total) by mouth daily with breakfast. 10 tablet Scot Jun, FNP   doxycycline (VIBRAMYCIN) 100 MG capsule Take 1 capsule (100 mg total) by mouth 2 (two) times daily. 20 capsule Scot Jun, FNP   promethazine-dextromethorphan (PROMETHAZINE-DM) 6.25-15 MG/5ML syrup Take 5 mLs by mouth 3 (three) times daily as needed for cough. 140 mL Scot Jun, FNP      PDMP not reviewed this encounter.   Scot Jun, FNP 12/18/21 1719

## 2021-12-17 NOTE — Discharge Instructions (Addendum)
Take medication as prescribed.  I will notify you once your chest x-ray has been reviewed by the radiologist if any additional medication is needed.  In the meantime start your doxycycline and prednisone.  Continue your home inhaler treatments.  Continue your home oxygen as needed.  If any of your symptoms worsen or become severe go immediately to the emergency department.

## 2022-01-31 ENCOUNTER — Telehealth: Payer: Self-pay | Admitting: Acute Care

## 2022-01-31 NOTE — Telephone Encounter (Signed)
Left message for pt to call to schedule f/u lung screening CT scan.

## 2022-02-04 NOTE — Telephone Encounter (Signed)
Returned call to patient for [facility] scheduling.  Left Voicemail.

## 2022-02-05 ENCOUNTER — Other Ambulatory Visit: Payer: Self-pay

## 2022-02-05 DIAGNOSIS — F17210 Nicotine dependence, cigarettes, uncomplicated: Secondary | ICD-10-CM

## 2022-02-05 DIAGNOSIS — Z87891 Personal history of nicotine dependence: Secondary | ICD-10-CM

## 2022-02-14 ENCOUNTER — Other Ambulatory Visit: Payer: Self-pay

## 2022-02-14 ENCOUNTER — Ambulatory Visit
Admission: RE | Admit: 2022-02-14 | Discharge: 2022-02-14 | Disposition: A | Discharge status: Home / Self Care | Payer: BC Managed Care – PPO | Source: Ambulatory Visit | Attending: Acute Care | Admitting: Acute Care

## 2022-02-14 DIAGNOSIS — Z87891 Personal history of nicotine dependence: Secondary | ICD-10-CM | POA: Diagnosis present

## 2022-02-14 DIAGNOSIS — F17210 Nicotine dependence, cigarettes, uncomplicated: Secondary | ICD-10-CM | POA: Insufficient documentation

## 2022-02-17 ENCOUNTER — Other Ambulatory Visit: Payer: Self-pay

## 2022-02-17 DIAGNOSIS — F17210 Nicotine dependence, cigarettes, uncomplicated: Secondary | ICD-10-CM

## 2022-02-17 DIAGNOSIS — Z87891 Personal history of nicotine dependence: Secondary | ICD-10-CM

## 2022-03-25 ENCOUNTER — Other Ambulatory Visit: Payer: Self-pay | Admitting: Primary Care

## 2022-03-25 DIAGNOSIS — J449 Chronic obstructive pulmonary disease, unspecified: Secondary | ICD-10-CM

## 2022-04-15 ENCOUNTER — Telehealth: Payer: Self-pay | Admitting: Internal Medicine

## 2022-04-15 DIAGNOSIS — J449 Chronic obstructive pulmonary disease, unspecified: Secondary | ICD-10-CM

## 2022-04-15 NOTE — Telephone Encounter (Signed)
Spoke to patient. She stated that her current oxygen machine is broken. She would like for Dr. Mortimer Fries to place an order to [facility] to d/c oxygen. She would like to purchase oxygen from another DME company. She stated that she would research her options. I have offered to contact [facility] to have them come out to service her machine and she denied.  ? ?Dr. Mortimer Fries, please advise. Thanks ?

## 2022-04-16 NOTE — Telephone Encounter (Signed)
Lm for patient.  

## 2022-04-17 NOTE — Telephone Encounter (Signed)
Spoke to patient and relayed below message. She voiced her understanding.  ?She stated that she is still in the process of researching oxygen concentrators to purchase.  ?I asked if she would like for me to hold off on placing the order to [facility] to D/C oxygen until she has a new company and she declined offer. She would like order placed now.  ?Order has been placed. ?Nothing further needed.  ? ?

## 2022-04-17 NOTE — Telephone Encounter (Signed)
Lm x2 for patient.  Will close encounter per office protocol.   

## 2022-04-17 NOTE — Addendum Note (Signed)
Addended by: Claudette Head A on: 04/17/2022 02:52 PM ? ? Modules accepted: Orders ? ?

## 2022-04-18 NOTE — Telephone Encounter (Signed)
Spoke to patient. She stated that an order is needed for oxygen. She would like to purchase a concentrator from [facility]. ? ?Dr. Mortimer Fries, please advise if okay to place order. Thanks ?

## 2022-04-18 NOTE — Telephone Encounter (Signed)
Spoke to patient.  ?She stated that she purchased a concentrator from [employer]. She stated that she does not need order to be placed to [facility].  ?Nothing further needed.  ? ?

## 2022-04-21 ENCOUNTER — Telehealth: Payer: Self-pay | Admitting: Internal Medicine

## 2022-04-21 DIAGNOSIS — J449 Chronic obstructive pulmonary disease, unspecified: Secondary | ICD-10-CM

## 2022-04-21 NOTE — Telephone Encounter (Signed)
Patient states that she found a concentrator, but needs an order for it. Patient requested to speak with a nurse. Call back number, [phone] ? ?Please advise. ? ? ?

## 2022-04-21 NOTE — Telephone Encounter (Signed)
Please refer to 04/15/2022 phone note.  ? ?Spoke to patient.  ?She would like an order sent to [facility] fax number [phone]. ?Order placed. ?She would like a Rx for spiriva handihaler. At last OV handihaler was D/C and respimat was given. She stated that she prefers handihaler.  ? ?Dr. Mortimer Fries, please advise. Thanks  ?

## 2022-04-28 MED ORDER — SPIRIVA HANDIHALER 18 MCG IN CAPS: ORAL_CAPSULE | RESPIRATORY_TRACT | 5 refills | Status: DC

## 2022-04-28 NOTE — Telephone Encounter (Signed)
Lm for patient.  

## 2022-04-28 NOTE — Telephone Encounter (Signed)
Spiriva handihaler has been sent to preferred pharmacy. ?Patient is aware and voiced her understanding.  ?Nothing further needed.  ? ?

## 2022-07-18 ENCOUNTER — Other Ambulatory Visit: Payer: Self-pay | Admitting: Physician Assistant

## 2022-07-18 DIAGNOSIS — Z1231 Encounter for screening mammogram for malignant neoplasm of breast: Secondary | ICD-10-CM

## 2022-09-22 ENCOUNTER — Encounter: Payer: Self-pay | Admitting: *Deleted

## 2022-09-22 ENCOUNTER — Ambulatory Visit: Payer: BC Managed Care – PPO | Attending: Cardiology | Admitting: Cardiology

## 2022-09-22 ENCOUNTER — Encounter: Payer: Self-pay | Admitting: Cardiology

## 2022-09-22 VITALS — BP 128/64 | HR 100 | Ht 60.0 in | Wt 133.0 lb

## 2022-09-22 DIAGNOSIS — I10 Essential (primary) hypertension: Secondary | ICD-10-CM

## 2022-09-22 DIAGNOSIS — R072 Precordial pain: Secondary | ICD-10-CM

## 2022-09-22 DIAGNOSIS — R002 Palpitations: Secondary | ICD-10-CM

## 2022-09-22 DIAGNOSIS — I2510 Atherosclerotic heart disease of native coronary artery without angina pectoris: Secondary | ICD-10-CM | POA: Diagnosis not present

## 2022-09-22 DIAGNOSIS — F17200 Nicotine dependence, unspecified, uncomplicated: Secondary | ICD-10-CM

## 2022-09-22 DIAGNOSIS — Z13220 Encounter for screening for lipoid disorders: Secondary | ICD-10-CM

## 2022-09-22 MED ORDER — ASPIRIN 81 MG PO TBEC: 81.0000 mg | DELAYED_RELEASE_TABLET | Freq: Every day | ORAL | 3 refills | Status: DC

## 2022-09-22 NOTE — Progress Notes (Signed)
Cardiology Office Note:    Date:  09/22/2022   ID:  [name], DOB Jun 06, 1956, MRN [id number]  PCP:  Rutherford Limerick, PA   [facility] Providers Cardiologist:  None     Referring MD: Rutherford Limerick, PA   Chief Complaint  Patient presents with   New Patient (Initial Visit)    HTN, heart racing left arm pain   Emily Figueroa is a 66 y.o. female who is being seen today for the evaluation of hypertension at the request of Rutherford Limerick, PA.  History of Present Illness:    Emily Figueroa is a 67 y.o. female with a hx of CAD  (LAD and RCA CA2+ on chest CT), COPD on oxygen nightly, current smoker x40+ years who presents due to chest pain, palpitations and shortness of breath.  She states having palpitations ongoing over the past several months, occurring at least twice weekly, lasting a minute.  Symptoms of palpitations are sometimes associated with chest pain.  She is a current smoker, working on quitting.  Mother had coronary artery bypass in her 70s.  Takes lisinopril as prescribed for BP control.  Also on inhalers due to COPD, uses oxygen at night.  She feels her breathing has worsened.  Chest CT lung cancer screening 02/2022 showed LAD and RCA calcifications.  Past Medical History:  Diagnosis Date   Abdominal pain, generalized    Arthritis    Bell's palsy    Chicken pox    COPD (chronic obstructive pulmonary disease) (HCC)    Diarrhea    Dysphagia    GERD (gastroesophageal reflux disease)    Hypertension    Weight loss     Past Surgical History:  Procedure Laterality Date   COLONOSCOPY WITH PROPOFOL N/A 09/14/2015   Procedure: COLONOSCOPY WITH PROPOFOL;  Surgeon: Manya Silvas, MD;  Location: [place];  Service: Endoscopy;  Laterality: N/A;   DIAGNOSTIC LAPAROSCOPY     ESOPHAGOGASTRODUODENOSCOPY (EGD) WITH PROPOFOL N/A 09/14/2015   Procedure: ESOPHAGOGASTRODUODENOSCOPY (EGD) WITH PROPOFOL;  Surgeon: Manya Silvas, MD;  Location: [facility]  ENDOSCOPY;  Service: Endoscopy;  Laterality: N/A;   SAVORY DILATION N/A 09/14/2015   Procedure: SAVORY DILATION;  Surgeon: Manya Silvas, MD;  Location: [facility] ENDOSCOPY;  Service: Endoscopy;  Laterality: N/A;   TEMPOROMANDIBULAR JOINT SURGERY      Current Medications: Current Meds  Medication Sig   albuterol (VENTOLIN HFA) 108 (90 Base) MCG/ACT inhaler Inhale 2 puffs into the lungs every 4 (four) hours as needed for wheezing or shortness of breath.   ALLEGRA ALLERGY 180 MG tablet Take 180 mg by mouth daily.   aspirin EC 81 MG tablet Take 1 tablet (81 mg total) by mouth daily. Swallow whole.   buPROPion ER (WELLBUTRIN SR) 100 MG 12 hr tablet Take 100 mg by mouth 2 (two) times daily.   folic acid (FOLVITE) 1 MG tablet Take 1 mg by mouth daily.   lisinopril (ZESTRIL) 20 MG tablet Take 20 mg by mouth daily.   tiotropium (SPIRIVA HANDIHALER) 18 MCG inhalation capsule PLACE 1 CAPSULE INTO INHALER AND INHALE ONCE DAILY.     Allergies:   Spiriva handihaler [tiotropium bromide monohydrate] and Symbicort [budesonide-formoterol fumarate]   Social History   Socioeconomic History   Marital status: Married    Spouse name: Not on file   Number of children: Not on file   Years of education: Not on file   Highest education level: Not on file  Occupational History  Not on file  Tobacco Use   Smoking status: Every Day    Packs/day: 0.10    Years: 47.00    Total pack years: 4.70    Types: Cigarettes   Smokeless tobacco: Never   Tobacco comments:    2 cigs daily--02/20/2021  Vaping Use   Vaping Use: Never used  Substance and Sexual Activity   Alcohol use: No   Drug use: No   Sexual activity: Not Currently    Birth control/protection: Post-menopausal  Other Topics Concern   Not on file  Social History Narrative   Not on file   Social Determinants of Health   Financial Resource Strain: Not on file  Food Insecurity: Not on file  Transportation Needs: Not on file  Physical Activity:  Not on file  Stress: Not on file  Social Connections: Not on file     Family History: The patient's family history includes Heart Problems in her mother; Leukemia in her mother.  ROS:   Please see the history of present illness.     All other systems reviewed and are negative.  EKGs/Labs/Other Studies Reviewed:    The following studies were reviewed today:   EKG:  EKG is  ordered today.  The ekg ordered today demonstrates normal sinus rhythm, possible left atrial enlargement  Recent Labs: No results found for requested labs within last 365 days.  Recent Lipid Panel No results found for: "CHOL", "TRIG", "HDL", "CHOLHDL", "VLDL", "LDLCALC", "LDLDIRECT"   Risk Assessment/Calculations:             Physical Exam:    VS:  BP 128/64 (BP Location: Right Arm)   Pulse 100   Ht 5' (1.524 m)   Wt 133 lb (60.3 kg)   SpO2 97% Comment: 1 liter in evening  BMI 25.97 kg/m     Wt Readings from Last 3 Encounters:  09/22/22 133 lb (60.3 kg)  02/14/22 134 lb (60.8 kg)  09/18/21 128 lb (58.1 kg)     GEN:  Well nourished, well developed in no acute distress HEENT: Normal NECK: No JVD; No carotid bruits CARDIAC: RRR, no murmurs, rubs, gallops RESPIRATORY: Diminished breath sounds bilaterally, no wheezing ABDOMEN: Soft, non-tender, non-distended MUSCULOSKELETAL:  No edema; No deformity  SKIN: Warm and dry NEUROLOGIC:  Alert and oriented x 3 PSYCHIATRIC:  Normal affect   ASSESSMENT:    1. Precordial pain   2. Coronary artery disease involving native coronary artery of native heart, unspecified whether angina present   3. Primary hypertension   4. Smoking   5. Palpitations   6. Screening for hyperlipidemia    PLAN:    In order of problems listed above:  Chest pain, risk factors hypertension, current smoker.  Coronary calcifications on chest CT.  Start aspirin 81 mg, get echocardiogram, get Lexiscan Myoview. Coronary calcifications, start aspirin as above, obtain fasting  lipid profile. Hypertension, BP control, continue lisinopril 20 mg daily Current smoker, smoking cessation is advised Palpitations, place cardiac monitor to evaluate any significant arrhythmia LDL goal less than 70, obtain fasting lipid profile as above  Follow-up after cardiac testing     Shared Decision Making/Informed Consent The risks [chest pain, shortness of breath, cardiac arrhythmias, dizziness, blood pressure fluctuations, myocardial infarction, stroke/transient ischemic attack, nausea, vomiting, allergic reaction, radiation exposure, metallic taste sensation and life-threatening complications (estimated to be 1 in 10,000)], benefits (risk stratification, diagnosing coronary artery disease, treatment guidance) and alternatives of a nuclear stress test were discussed in detail with Ms. Lipkin and  she agrees to proceed.    Medication Adjustments/Labs and Tests Ordered: Current medicines are reviewed at length with the patient today.  Concerns regarding medicines are outlined above.  Orders Placed This Encounter  Procedures   NM Myocar Multi W/Spect W/Wall Motion / EF   Lipid panel   EKG 12-Lead   ECHOCARDIOGRAM COMPLETE   Meds ordered this encounter  Medications   aspirin EC 81 MG tablet    Sig: Take 1 tablet (81 mg total) by mouth daily. Swallow whole.    Dispense:  90 tablet    Refill:  3    Patient Instructions  Medication Instructions:   Your physician has recommended you make the following change in your medication:    START taking Aspirin 81 MG once a day.  *If you need a refill on your cardiac medications before your next appointment, please call your pharmacy*   Lab Work:  Your physician recommends that you return for a FASTING lipid profile: At your earliest convenience.  - You will need to be fasting. Please do not have anything to eat or drink after midnight the morning you have the lab work. You may only have water or black coffee with no cream or  sugar.   - Please go to the [facility]. You will check in at the front desk to the right as you walk into the atrium. Valet Parking is offered if needed. - No appointment needed. You may go any day between 7 am and 6 pm.  you to review the results.   Testing/Procedures:  Your physician has requested that you have an echocardiogram. Echocardiography is a painless test that uses sound waves to create images of your heart. It provides your doctor with information about the size and shape of your heart and how well your heart's chambers and valves are working. This procedure takes approximately one hour. There are no restrictions for this procedure.  2.    [place]   Your caregiver has ordered a Stress Test with nuclear imaging. The purpose of this test is to evaluate the blood supply to your heart muscle. This procedure is referred to as a "Non-Invasive Stress Test." This is because other than having an IV started in your vein, nothing is inserted or "invades" your body. Cardiac stress tests are done to find areas of poor blood flow to the heart by determining the extent of coronary artery disease (CAD). Some patients exercise on a treadmill, which naturally increases the blood flow to your heart, while others who are  unable to walk on a treadmill due to physical limitations have a pharmacologic/chemical stress agent called Lexiscan . This medicine will mimic walking on a treadmill by temporarily increasing your coronary blood flow.      PLEASE REPORT TO [facility] MEDICAL MALL ENTRANCE   THE VOLUNTEERS AT THE FIRST DESK WILL DIRECT YOU WHERE TO GO     *Please note: these test may take anywhere between 2-4 hours to complete       Date of Procedure:_____________________________________   Arrival Time for Procedure:______________________________    PLEASE NOTIFY THE OFFICE AT LEAST 24 HOURS IN ADVANCE IF YOU ARE UNABLE TO KEEP YOUR APPOINTMENT.  [place] 24 HOURS IN ADVANCE IF YOU ARE UNABLE TO KEEP YOUR APPOINTMENT. [phone]     How to prepare for your Myoview test:         ____:  Hold diabetes medication the morning  of procedure:   ____:  Hold betablocker(s) night before procedure and morning of procedure (onlyfor exercise or dobutamine studies )   ____:  Hold other medications as follows:     1. Do not eat or drink after midnight  2. No caffeine for 24 hours prior to test  3. No smoking 24 hours prior to test.  4. Unless instructed otherwise, Take your medication with a small sips of water.    5.         Ladies, please do not wear dresses. Skirts or pants are appropriate. Please wear a short sleeve shirt.  6. No perfume, cologne or lotion.  7. Wear comfortable walking shoes. No heels!      Your physician has recommended that you wear a Zio XT monitor for 2 weeks. This will be mailed to your home address in 4-5 business days.   Your clinician has requested a Zio heart rhythm monitor by [employer] to be mailed to your home for you to wear for 14 days. You should expect a small box to arrive via USPS (or FedEx in some cases) within this next week. If you do not receive it please call [employer] at [phone].  Closely watching your heart at this time will help your care team understand more and provide information needed to develop your plan of care.  Please apply your Zio patch monitor the day you receive it. Keep this packaging, you will use this to return your Zio monitor.  You will easily be able to apply the monitor with the instructions provided in the Patient Guide.  If you need assistance, [employer] representatives are available 24/7 at [phone].  You can also download the [facility] app on your phone to view detailed application instructions and log symptoms.  After you wear your monitor for 14 days, place it back in the blue box or envelope, along with your Symptom Log.  To send your  monitor back: Simply use the pre-addressed and pre-paid box/envelope.  Send it back through [employer] the same day you remove it via your local post office or by placing it in your mailbox.  As soon as we receive the results, they will be reviewed and your clinician will contact you.  For the first 24 hours- it is essential to not shower or exercise, to allow the patch to adhere to your skin. Avoid excessive sweating to help maximize wear time. Do not submerge the device, no hot tubs, and no swimming pools. Keep any lotions or oils away from the patch. After 24 hours you may shower with the patch on. Take brief showers with your back facing the shower head.  Do not remove patch once it has been placed because that will interrupt data and decrease adhesive wear time. Push the button when you have any symptoms and write down what you were feeling. Once you have completed wearing your monitor, remove and place into box which has postage paid and place in your outgoing mailbox.  If for some reason you have misplaced your box then call our office and we can provide another box and/or mail it off for you.   Follow-Up: At [facility], you and your health needs are our priority.  As part of our continuing mission to provide you with exceptional heart care, we have created designated Provider Care Teams.  These Care Teams include your primary Cardiologist (physician) and Advanced Practice Providers (APPs -  Physician Assistants and Nurse Practitioners) who all work together  to provide you with the care you need, when you need it.  We recommend signing up for the patient portal called "MyChart".  Sign up information is provided on this After Visit Summary.  MyChart is used to connect with patients for Virtual Visits (Telemedicine).  Patients are able to view lab/test results, encounter notes, upcoming appointments, etc.  Non-urgent messages can be sent to your provider as well.   To learn more  about what you can do with MyChart, go to [email or web address].    Your next appointment:    Follow up 6-8 weeks  The format for your next appointment:   In Person  Provider:   You may see Kate Sable, MD or one of the following Advanced Practice Providers on your designated Care Team:   Murray Hodgkins, NP Christell Faith, PA-C Cadence Kathlen Mody, PA-C Gerrie Nordmann, NP    Other Instructions   Important Information About Sugar         Signed, Kate Sable, MD  09/22/2022 3:15 PM    [place]

## 2022-09-22 NOTE — Patient Instructions (Addendum)
Medication Instructions:   Your physician has recommended you make the following change in your medication:    START taking Aspirin 81 MG once a day.  *If you need a refill on your cardiac medications before your next appointment, please call your pharmacy*   Lab Work:  Your physician recommends that you return for a FASTING lipid profile: At your earliest convenience.  - You will need to be fasting. Please do not have anything to eat or drink after midnight the morning you have the lab work. You may only have water or black coffee with no cream or sugar.   - Please go to the [facility]. You will check in at the front desk to the right as you walk into the atrium. Valet Parking is offered if needed. - No appointment needed. You may go any day between 7 am and 6 pm.  you to review the results.   Testing/Procedures:  Your physician has requested that you have an echocardiogram. Echocardiography is a painless test that uses sound waves to create images of your heart. It provides your doctor with information about the size and shape of your heart and how well your heart's chambers and valves are working. This procedure takes approximately one hour. There are no restrictions for this procedure.  2.    [place]   Your caregiver has ordered a Stress Test with nuclear imaging. The purpose of this test is to evaluate the blood supply to your heart muscle. This procedure is referred to as a "Non-Invasive Stress Test." This is because other than having an IV started in your vein, nothing is inserted or "invades" your body. Cardiac stress tests are done to find areas of poor blood flow to the heart by determining the extent of coronary artery disease (CAD). Some patients exercise on a treadmill, which naturally increases the blood flow to your heart, while others who are  unable to walk on a treadmill due to physical limitations have a pharmacologic/chemical stress agent called Lexiscan .  This medicine will mimic walking on a treadmill by temporarily increasing your coronary blood flow.      PLEASE REPORT TO [facility] MEDICAL MALL ENTRANCE   THE VOLUNTEERS AT THE FIRST DESK WILL DIRECT YOU WHERE TO GO     *Please note: these test may take anywhere between 2-4 hours to complete       Date of Procedure:_____________________________________   Arrival Time for Procedure:______________________________    PLEASE NOTIFY THE OFFICE AT LEAST 24 HOURS IN ADVANCE IF YOU ARE UNABLE TO KEEP YOUR APPOINTMENT.  [place] 24 HOURS IN ADVANCE IF YOU ARE UNABLE TO KEEP YOUR APPOINTMENT. [phone]     How to prepare for your Myoview test:         ____:  Hold diabetes medication the morning of procedure:   ____:  Hold betablocker(s) night before procedure and morning of procedure (onlyfor exercise or dobutamine studies )   ____:  Hold other medications as follows:     1. Do not eat or drink after midnight  2. No caffeine for 24 hours prior to test  3. No smoking 24 hours prior to test.  4. Unless instructed otherwise, Take your medication with a small sips of water.    5.         Ladies, please do not wear dresses. Skirts or pants are appropriate. Please wear a short sleeve shirt.  6. No perfume,  cologne or lotion.  7. Wear comfortable walking shoes. No heels!      Your physician has recommended that you wear a Zio XT monitor for 2 weeks. This will be mailed to your home address in 4-5 business days.   Your clinician has requested a Zio heart rhythm monitor by [employer] to be mailed to your home for you to wear for 14 days. You should expect a small box to arrive via USPS (or FedEx in some cases) within this next week. If you do not receive it please call [employer] at [phone].  Closely watching your heart at this time will help your care team understand more and provide information needed to develop your plan of  care.  Please apply your Zio patch monitor the day you receive it. Keep this packaging, you will use this to return your Zio monitor.  You will easily be able to apply the monitor with the instructions provided in the Patient Guide.  If you need assistance, [employer] representatives are available 24/7 at [phone].  You can also download the [facility] app on your phone to view detailed application instructions and log symptoms.  After you wear your monitor for 14 days, place it back in the blue box or envelope, along with your Symptom Log.  To send your monitor back: Simply use the pre-addressed and pre-paid box/envelope.  Send it back through [employer] the same day you remove it via your local post office or by placing it in your mailbox.  As soon as we receive the results, they will be reviewed and your clinician will contact you.  For the first 24 hours- it is essential to not shower or exercise, to allow the patch to adhere to your skin. Avoid excessive sweating to help maximize wear time. Do not submerge the device, no hot tubs, and no swimming pools. Keep any lotions or oils away from the patch. After 24 hours you may shower with the patch on. Take brief showers with your back facing the shower head.  Do not remove patch once it has been placed because that will interrupt data and decrease adhesive wear time. Push the button when you have any symptoms and write down what you were feeling. Once you have completed wearing your monitor, remove and place into box which has postage paid and place in your outgoing mailbox.  If for some reason you have misplaced your box then call our office and we can provide another box and/or mail it off for you.   Follow-Up: At [facility], you and your health needs are our priority.  As part of our continuing mission to provide you with exceptional heart care, we have created designated Provider Care Teams.  These Care Teams include your  primary Cardiologist (physician) and Advanced Practice Providers (APPs -  Physician Assistants and Nurse Practitioners) who all work together to provide you with the care you need, when you need it.  We recommend signing up for the patient portal called "MyChart".  Sign up information is provided on this After Visit Summary.  MyChart is used to connect with patients for Virtual Visits (Telemedicine).  Patients are able to view lab/test results, encounter notes, upcoming appointments, etc.  Non-urgent messages can be sent to your provider as well.   To learn more about what you can do with MyChart, go to [email or web address].    Your next appointment:    Follow up 6-8 weeks  The format for your next appointment:  In Person  Provider:   You may see Kate Sable, MD or one of the following Advanced Practice Providers on your designated Care Team:   Murray Hodgkins, NP Christell Faith, PA-C Cadence Kathlen Mody, PA-C Gerrie Nordmann, NP    Other Instructions   Important Information About Sugar

## 2022-09-23 ENCOUNTER — Other Ambulatory Visit
Admission: RE | Admit: 2022-09-23 | Discharge: 2022-09-23 | Disposition: A | Discharge status: Home / Self Care | Payer: BC Managed Care – PPO | Attending: Cardiology | Admitting: Cardiology

## 2022-09-23 DIAGNOSIS — Z13220 Encounter for screening for lipoid disorders: Secondary | ICD-10-CM | POA: Insufficient documentation

## 2022-09-23 LAB — LIPID PANEL
Cholesterol: 222 mg/dL — ABNORMAL HIGH (ref 0–200)
HDL: 79 mg/dL (ref >40)
LDL Cholesterol: 133 mg/dL — ABNORMAL HIGH (ref 0–99)
Total CHOL/HDL Ratio: 2.8 RATIO
Triglycerides: 52 mg/dL (ref <150)
VLDL: 10 mg/dL (ref 0–40)

## 2022-09-25 ENCOUNTER — Ambulatory Visit (INDEPENDENT_AMBULATORY_CARE_PROVIDER_SITE_OTHER): Payer: BC Managed Care – PPO | Admitting: Internal Medicine

## 2022-09-25 ENCOUNTER — Telehealth: Payer: Self-pay | Admitting: Internal Medicine

## 2022-09-25 ENCOUNTER — Encounter: Payer: Self-pay | Admitting: Internal Medicine

## 2022-09-25 VITALS — BP 134/82 | HR 84 | Temp 98.3°F | Ht 60.0 in | Wt 132.2 lb

## 2022-09-25 DIAGNOSIS — I2510 Atherosclerotic heart disease of native coronary artery without angina pectoris: Secondary | ICD-10-CM

## 2022-09-25 DIAGNOSIS — F17210 Nicotine dependence, cigarettes, uncomplicated: Secondary | ICD-10-CM

## 2022-09-25 DIAGNOSIS — Z720 Tobacco use: Secondary | ICD-10-CM

## 2022-09-25 DIAGNOSIS — J449 Chronic obstructive pulmonary disease, unspecified: Secondary | ICD-10-CM | POA: Diagnosis not present

## 2022-09-25 MED ORDER — BREZTRI AEROSPHERE 160-9-4.8 MCG/ACT IN AERO: 2.0000 | INHALATION_SPRAY | Freq: Two times a day (BID) | RESPIRATORY_TRACT | 10 refills | Status: DC

## 2022-09-25 MED ORDER — BREZTRI AEROSPHERE 160-9-4.8 MCG/ACT IN AERO: 2.0000 | INHALATION_SPRAY | Freq: Two times a day (BID) | RESPIRATORY_TRACT | 0 refills | Status: DC

## 2022-09-25 NOTE — Patient Instructions (Addendum)
stop Spiriva Lets start Breztri 2 puffs twice daily rinse mouth after every use(education provided on how to use) Please stop smoking ALbuterol as needed

## 2022-09-25 NOTE — Telephone Encounter (Signed)
Dr. Kasa, please see below message and advise. Thanks °

## 2022-09-25 NOTE — Progress Notes (Signed)
[facility] Consultation    SYNOPSIS  The patient is a 66 year old female with history of COPD.  He has had a possible allergic reaction to Symbicort in the past, asked to maintain on Spiriva.  Patient also has a history of OSA but did not start CPAP due to cost.  She is using spiriva once daily. She is still smoking about 5 per day,  She has been on chantix twice in the past and feels that it helped but does not want to try it again. She has smoked 1-1.5 ppd, started smoking at age of 64.  She is not interested in a referral to a dentist for possible dental device.   She has a history of TMJ.    Desat walk 01/01/18; on RA at rest her sat is 98% and HR 95. She walked 360 feet at brisk pace, moderate dyspena, Sat 95% and HR 101.  Imaging personally reviewed, chest x-ray 09/17/17, hyperinflation consistent with emphysema.  CT chest 04/09/12 moderate to severe changes of apical emphysema  Date: 09/25/2022  MRN# [id number] DONIQUE HAMMONDS June 14, 1956    CC Follow up COPD  HPI No exacerbation at this time No evidence of heart failure at this time No evidence or signs of infection at this time No respiratory distress No fevers, chills, nausea, vomiting, diarrhea No evidence of lower extremity edema No evidence hemoptysis  Although she does not have an exacerbation at this time her COPD does not seem to be well controlled Has increased work of breathing Patient would like to try  alternative inhaler therapy  Smoking Assessment and Cessation Counseling Upon further questioning, Patient smokes 1/2 PPD I have advised patient to quit/stop smoking as soon as possible due to high risk for multiple medical problems  Patient is NOT willing to quit smoking  I have advised patient that we can assist and have options of Nicotine replacement therapy. I also advised patient on behavioral therapy and can provide oral medication therapy in conjunction with the other therapies Follow  up next Office visit  for assessment of smoking cessation Smoking cessation counseling advised for 4 minutes   Patient uses oxygen as needed at nighttime Continue as prescribed  Pulmonary function test 2020 +[employer] and +hyperinflation related to probable underlying small overactive airways disease     BP 134/82 (BP Location: Left Arm, Cuff Size: Normal)   Pulse 84   Temp 98.3 F (36.8 C) (Temporal)   Ht 5' (1.524 m)   Wt 132 lb 3.2 oz (60 kg)   SpO2 100%   BMI 25.82 kg/m     Review of Systems: Gen:  Denies  fever, sweats, chills weight loss  HEENT: Denies blurred vision, double vision, ear pain, eye pain, hearing loss, nose bleeds, sore throat Cardiac:  No dizziness, chest pain or heaviness, chest tightness,edema, No JVD Resp:   No cough, -sputum production, +shortness of breath,+wheezing, -hemoptysis,  Other:  All other systems negative    Physical Examination:   General Appearance: No distress  EYES PERRLA, EOM intact.   NECK Supple, No JVD Pulmonary: normal breath sounds, No wheezing.  CardiovascularNormal S1,S2.  No m/r/g.   Abdomen: Benign, Soft, non-tender. ALL OTHER ROS ARE NEGATIVE        Social Hx:   Social History   Tobacco Use   Smoking status: Every Day    Packs/day: 0.10    Years: 47.00    Total pack years: 4.70    Types: Cigarettes   Smokeless tobacco:  Never   Tobacco comments:    2 cigs daily--02/20/2021  Vaping Use   Vaping Use: Never used  Substance Use Topics   Alcohol use: No   Drug use: No   Medication:    Current Outpatient Medications:    albuterol (VENTOLIN HFA) 108 (90 Base) MCG/ACT inhaler, Inhale 2 puffs into the lungs every 4 (four) hours as needed for wheezing or shortness of breath., Disp: 1 g, Rfl: 6   ALLEGRA ALLERGY 180 MG tablet, Take 180 mg by mouth daily., Disp: , Rfl:    aspirin EC 81 MG tablet, Take 1 tablet (81 mg total) by mouth daily. Swallow whole., Disp: 90 tablet, Rfl: 3   buPROPion ER (WELLBUTRIN  SR) 100 MG 12 hr tablet, Take 100 mg by mouth 2 (two) times daily., Disp: , Rfl:    conjugated estrogens (PREMARIN) vaginal cream, Place 1 Applicatorful vaginally daily. (Patient not taking: Reported on 09/22/2022), Disp: 42.5 g, Rfl: 12   doxycycline (VIBRAMYCIN) 100 MG capsule, Take 1 capsule (100 mg total) by mouth 2 (two) times daily. (Patient not taking: Reported on 09/22/2022), Disp: 20 capsule, Rfl: 0   fluticasone (FLONASE) 50 MCG/ACT nasal spray, , Disp: , Rfl:    folic acid (FOLVITE) 1 MG tablet, Take 1 mg by mouth daily., Disp: , Rfl:    gabapentin (NEURONTIN) 100 MG capsule, , Disp: , Rfl:    lisinopril (ZESTRIL) 20 MG tablet, Take 20 mg by mouth daily., Disp: , Rfl:    montelukast (SINGULAIR) 10 MG tablet, montelukast 10 mg tablet (Patient not taking: Reported on 09/22/2022), Disp: , Rfl:    omeprazole (PRILOSEC) 20 MG capsule, Take 20 mg by mouth daily. (Patient not taking: Reported on 09/22/2022), Disp: , Rfl:    predniSONE (DELTASONE) 20 MG tablet, Take 2 tablets (40 mg total) by mouth daily with breakfast. (Patient not taking: Reported on 09/22/2022), Disp: 10 tablet, Rfl: 0   promethazine-dextromethorphan (PROMETHAZINE-DM) 6.25-15 MG/5ML syrup, Take 5 mLs by mouth 3 (three) times daily as needed for cough. (Patient not taking: Reported on 09/22/2022), Disp: 140 mL, Rfl: 0   solifenacin (VESICARE) 5 MG tablet, Take 1 tablet (5 mg total) by mouth daily. (Patient not taking: Reported on 09/22/2022), Disp: 30 tablet, Rfl: 1   tiotropium (SPIRIVA HANDIHALER) 18 MCG inhalation capsule, PLACE 1 CAPSULE INTO INHALER AND INHALE ONCE DAILY., Disp: 30 capsule, Rfl: 5   Allergies:  Spiriva handihaler [tiotropium bromide monohydrate] and Symbicort [budesonide-formoterol fumarate]  CT chest 3/23 lung cancer screening protocol Extensive bilateral upper lobe predominant emphysema No significant nodules or masses seen   Assessment and Plan:  66 year old [place] female seen today for follow-up  assessment for 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 and progressive shortness of breath  COPD does not seem to be in control with Spiriva therapy And has a history of oral thrush and really does not want to try any additional therapy however I suggested a Plan to change to Breztri to assess respiratory status Albuterol as needed   Chronic hypoxic respiratory failure Continue oxygen as prescribed  Lung cancer screening protocol Extensive emphysema Follow up as scheduled  Smoking cessation strongly advised     MEDICATION ADJUSTMENTS/LABS AND TESTS ORDERED: Try Breztri inhaler Albuterol as needed and prior to exertion Avoid secondhand smoke exposure Continue lung cancer screening protocol Continue oxygen as needed Please stop smoking   CURRENT MEDICATIONS REVIEWED AT LENGTH WITH PATIENT TODAY   Patient satisfied with Plan of action and management. All questions answered  Follow-up  in 6 months  Total time spent 31 minutes   Corrin Parker, M.D.  [facility]  Medical Director [place] Director [facility] Cardio-Pulmonary Department

## 2022-09-26 ENCOUNTER — Telehealth: Payer: Self-pay

## 2022-09-26 MED ORDER — ATORVASTATIN CALCIUM 40 MG PO TABS: 40.0000 mg | ORAL_TABLET | Freq: Every day | ORAL | 3 refills | Status: DC

## 2022-09-26 NOTE — Telephone Encounter (Signed)
Called patient and left a detailed VM per DPR on file. Encouraged patient to call back with any questions or concerns. Sent prescription into pharmacy.

## 2022-09-26 NOTE — Telephone Encounter (Signed)
-----   Message from Kate Sable, MD sent at 09/25/2022  5:45 PM EDT ----- Cholesterol not adequately controlled, start Lipitor 40 mg daily

## 2022-09-29 NOTE — Telephone Encounter (Signed)
Patient is aware of below message/recommendations and voiced her understanding.  1 year recall has been placed. Nothing further needed.

## 2022-10-16 ENCOUNTER — Telehealth: Payer: Self-pay | Admitting: Cardiology

## 2022-10-16 DIAGNOSIS — R002 Palpitations: Secondary | ICD-10-CM

## 2022-10-16 NOTE — Telephone Encounter (Signed)
Patient states she never received her monitor.

## 2022-10-16 NOTE — Telephone Encounter (Signed)
Zio monitor order was not placed at [facility]. Order placed. Called patient and informed her of this. Patient was very understanding. Made aware to call us in a week if she has not received her monitor by then.  Patient was grateful for the follow up.

## 2022-10-20 ENCOUNTER — Ambulatory Visit
Admission: RE | Admit: 2022-10-20 | Discharge: 2022-10-20 | Disposition: A | Discharge status: Home / Self Care | Payer: BC Managed Care – PPO | Source: Ambulatory Visit | Attending: Cardiology | Admitting: Cardiology

## 2022-10-20 DIAGNOSIS — R072 Precordial pain: Secondary | ICD-10-CM | POA: Insufficient documentation

## 2022-10-20 LAB — NM MYOCAR MULTI W/SPECT W/WALL MOTION / EF
Base ST Depression (mm): 0 mm
LV dias vol: 47 mL (ref 46–106)
LV sys vol: 11 mL
Nuc Stress EF: 77 %
Peak HR: 112 {beats}/min
Percent HR: 72 %
Rest HR: 86 {beats}/min
Rest Nuclear Isotope Dose: 10.1 mCi
SDS: 2
SRS: 3
SSS: 4
ST Depression (mm): 0 mm
Stress Nuclear Isotope Dose: 31.1 mCi
TID: 0.83

## 2022-10-20 MED ORDER — REGADENOSON 0.4 MG/5ML IV SOLN
0.4000 mg | Freq: Once | INTRAVENOUS | Status: AC
  Administered 2022-10-20: 0.4 mg via INTRAVENOUS
  Filled 2022-10-20: qty 5

## 2022-10-20 MED ORDER — TECHNETIUM TC 99M TETROFOSMIN IV KIT
10.0600 | PACK | Freq: Once | INTRAVENOUS | Status: AC | PRN
  Administered 2022-10-20: 10.06 via INTRAVENOUS

## 2022-10-20 MED ORDER — TECHNETIUM TC 99M TETROFOSMIN IV KIT
31.0500 | PACK | Freq: Once | INTRAVENOUS | Status: AC | PRN
  Administered 2022-10-20: 31.05 via INTRAVENOUS

## 2022-10-31 ENCOUNTER — Telehealth: Payer: Self-pay | Admitting: Cardiology

## 2022-10-31 DIAGNOSIS — R002 Palpitations: Secondary | ICD-10-CM

## 2022-10-31 NOTE — Telephone Encounter (Signed)
New Message:    Patient said she still have not received her Monitor.

## 2022-10-31 NOTE — Telephone Encounter (Signed)
Sent the following email to [facility] at [facility] as copied below:    Will follow up with patient after I hear

## 2022-11-03 ENCOUNTER — Ambulatory Visit: Payer: BC Managed Care – PPO | Attending: Cardiology

## 2022-11-03 ENCOUNTER — Ambulatory Visit: Payer: BC Managed Care – PPO | Admitting: Internal Medicine

## 2022-11-03 DIAGNOSIS — R002 Palpitations: Secondary | ICD-10-CM

## 2022-11-03 NOTE — Telephone Encounter (Signed)
The patients Zio order did not get processed correctly through the system. First order was removed and a new one was entered. Left patient a detailed VM per DPR on file.

## 2022-11-03 NOTE — Telephone Encounter (Signed)
Patient called back. Informed her of the note below. Rescheduled her follow up to be with Gerrie Nordmann on 12/28 so we will have her Zio results in.

## 2022-11-11 DIAGNOSIS — R002 Palpitations: Secondary | ICD-10-CM | POA: Diagnosis not present

## 2022-11-14 ENCOUNTER — Ambulatory Visit: Payer: BC Managed Care – PPO | Attending: Cardiology

## 2022-11-14 DIAGNOSIS — R072 Precordial pain: Secondary | ICD-10-CM

## 2022-11-14 LAB — ECHOCARDIOGRAM COMPLETE
AR max vel: 3.11 cm2
AV Area VTI: 3.38 cm2
AV Area mean vel: 2.36 cm2
AV Mean grad: 3 mmHg
AV Peak grad: 4.8 mmHg
Ao pk vel: 1.09 m/s
Area-P 1/2: 3.65 cm2
Calc EF: 53.3 %
S' Lateral: 3 cm
Single Plane A2C EF: 52.4 %
Single Plane A4C EF: 52.8 %

## 2022-11-17 ENCOUNTER — Ambulatory Visit: Payer: BC Managed Care – PPO | Admitting: Cardiology

## 2022-11-25 ENCOUNTER — Ambulatory Visit: Payer: BC Managed Care – PPO | Admitting: Internal Medicine

## 2022-12-09 NOTE — Progress Notes (Deleted)
Cardiology Clinic Note   Patient Name: Emily Figueroa Date of Encounter: 12/09/2022  Primary Care Provider:  Rutherford Limerick, PA Primary Cardiologist:  None  Patient Profile    Emily Figueroa is a 66 y.o. female with a past medical history of CAD per CT, hypertension, palpitations, COPD, tobacco abuse who presents to the clinic today for follow-up of cardiac testing.   Past Medical History    Past Medical History:  Diagnosis Date   Abdominal pain, generalized    Arthritis    Bell's palsy    Chicken pox    COPD (chronic obstructive pulmonary disease) (HCC)    Diarrhea    Dysphagia    GERD (gastroesophageal reflux disease)    Hypertension    Weight loss    Past Surgical History:  Procedure Laterality Date   COLONOSCOPY WITH PROPOFOL N/A 09/14/2015   Procedure: COLONOSCOPY WITH PROPOFOL;  Surgeon: Manya Silvas, MD;  Location: [facility];  Service: Endoscopy;  Laterality: N/A;   DIAGNOSTIC LAPAROSCOPY     ESOPHAGOGASTRODUODENOSCOPY (EGD) WITH PROPOFOL N/A 09/14/2015   Procedure: ESOPHAGOGASTRODUODENOSCOPY (EGD) WITH PROPOFOL;  Surgeon: Manya Silvas, MD;  Location: [facility] ENDOSCOPY;  Service: Endoscopy;  Laterality: N/A;   SAVORY DILATION N/A 09/14/2015   Procedure: SAVORY DILATION;  Surgeon: Manya Silvas, MD;  Location: [facility] ENDOSCOPY;  Service: Endoscopy;  Laterality: N/A;   TEMPOROMANDIBULAR JOINT SURGERY      Allergies  Allergies  Allergen Reactions   Spiriva Handihaler [Tiotropium Bromide Monohydrate] Hives   Symbicort [Budesonide-Formoterol Fumarate] Hives    History of Present Illness    Emily Figueroa has a past medical history of: CAD.  CT chest (lung cancer screen) 12/10/2018: LAD and RCA atherosclerosis. Atherosclerotic nonaneurysmal thoracic aorta.  CT chest (lung cancer screen) 12/24/2020: Three vessel coronary atherosclerosis. Atherosclerotic nonaneurysmal thoracic aorta.  CT chest (lung cancer screen) 02/14/2022: Aortic atherosclerosis.  Atherosclerosis of the great vessels of the mediastinum and coronary arteries including calcified atherosclerotic plaque LAD, LCx and RCA.  Nuclear stress test 10/20/2022: Normal, low risk study. Mild aortic and coronary calcifications.  Echo 11/14/2022: EF 60-65%. Grade I DD.  Palpitations.  14-day Zio 12/01/2022: 8 SVT runs fastest 7 beats max rate 193 bpm, longest 10 beats.  Hypertension.  COPD.  On home O2 at night.  Tobacco abuse.   Emily Figueroa was first evaluated by Dr. Garen Lah on 09/22/2022 for hypertension and palpitations at the request of PCP, August Luz, PA. Patient reported a several month history of palpitations that are occasionally associated with chest pain. Stress test in November 2023 was a normal, low risk study showing mild aortic and coronary calcifications.  Echo with EF 60-65%, grade I DD. 14 day Zio showed occasional PSVT.   Today, patient ***  PRN BB?   CAD. Three vessel atherosclerosis (LAD, LCx, RCA) as seen on CT chest for lung cancer screening. Patient denies *** Continue aspirin and Lipitor.  Palpitations. 14-day Zio showed  occasional PSVT. Patient reports *** Hypertension. BP today *** Patient denies headaches or dizziness. Continue lisinopril.  Tobacco abuse. Patient ***  Home Medications    No outpatient medications have been marked as taking for the 12/11/22 encounter (Appointment) with Gerrie Nordmann, NP.    Family History    Family History  Problem Relation Age of Onset   Leukemia Mother    Heart Problems Mother    She indicated that her mother is deceased. She indicated that her father is alive.   Social History  Social History   Socioeconomic History   Marital status: Married    Spouse name: Not on file   Number of children: Not on file   Years of education: Not on file   Highest education level: Not on file  Occupational History   Not on file  Tobacco Use   Smoking status: Every Day    Packs/day: 1.00    Years: 47.00     Total pack years: 47.00    Types: Cigarettes   Smokeless tobacco: Never   Tobacco comments:    3 cigs daily--09/25/2022  Vaping Use   Vaping Use: Never used  Substance and Sexual Activity   Alcohol use: No   Drug use: No   Sexual activity: Not Currently    Birth control/protection: Post-menopausal  Other Topics Concern   Not on file  Social History Narrative   Not on file   Social Determinants of Health   Financial Resource Strain: Not on file  Food Insecurity: Not on file  Transportation Needs: Not on file  Physical Activity: Not on file  Stress: Not on file  Social Connections: Not on file  Intimate Partner Violence: Not on file     Review of Systems    General: *** No chills, fever, night sweats or weight changes.  Cardiovascular:  No chest pain, dyspnea on exertion, edema, orthopnea, palpitations, paroxysmal nocturnal dyspnea. Dermatological: No rash, lesions/masses Respiratory: No cough, dyspnea Urologic: No hematuria, dysuria Abdominal:   No nausea, vomiting, diarrhea, bright red blood per rectum, melena, or hematemesis Neurologic:  No visual changes, weakness, changes in mental status. All other systems reviewed and are otherwise negative except as noted above.  Physical Exam    VS:  There were no vitals taken for this visit. , BMI There is no height or weight on file to calculate BMI. GEN: *** Well nourished, well developed, in no acute distress. HEENT: Normal. Neck: Supple, no JVD, carotid bruits, or masses. Cardiac: RRR, no murmurs, rubs, or gallops. No clubbing, cyanosis, edema.  Radials/DP/PT 2+ and equal bilaterally.  Respiratory:  Respirations regular and unlabored, clear to auscultation bilaterally. GI: Soft, nontender, nondistended. MS: No deformity or atrophy. Skin: Warm and dry, no rash. Neuro: Strength and sensation are intact. Psych: Normal affect.  Accessory Clinical Findings    The following studies were reviewed for this  visit: ***  Recent Labs: No results found for requested labs within last 365 days.   Recent Lipid Panel    Component Value Date/Time   CHOL 222 (H) 09/23/2022 1014   TRIG 52 09/23/2022 1014   HDL 79 09/23/2022 1014   CHOLHDL 2.8 09/23/2022 1014   VLDL 10 09/23/2022 1014   LDLCALC 133 (H) 09/23/2022 1014    No BP recorded.  {Refresh Note OR Click here to enter BP  :1}***    ECG personally reviewed by me today: ***  No significant changes from ***  {Does this patient have ATRIAL FIBRILLATION?:[phone]}   Assessment & Plan   ***      Disposition: ***   Justice Britain. Lilla Callejo, DNP, NP-C     12/09/2022, 12:35 PM [facility] [address] Office [phone] Fax [phone]   I spent *** minutes examining this patient, reviewing medications, and using patient centered shared decision making involving her cardiac care.  Prior to her visit I spent greater than 20 minutes reviewing her past medical history,  medications, and prior cardiac tests.

## 2022-12-11 ENCOUNTER — Ambulatory Visit: Payer: BC Managed Care – PPO | Admitting: Cardiology

## 2022-12-30 IMAGING — CT CT CHEST LUNG CANCER SCREENING LOW DOSE W/O CM
2 of 5 series · 14 of 40 positions shown, 17 images · non-contrast
Comparison: Low-dose lung cancer screening chest CT 12/24/2020.

CLINICAL DATA: 66-year-old female current smoker with 72 pack-year
history of smoking. Follow-up for prior abnormal low-dose lung
cancer screening examination.



[Series 3: lung 1.00 · axial · 0.59mm/px · z∈[-1193,-894]mm · 11 of 329 slices shown, 14 images]
[im 15/329  mediastinal]
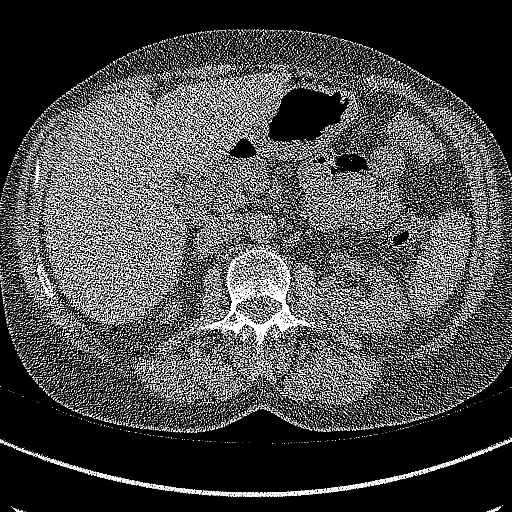
[im 15/329  lung]
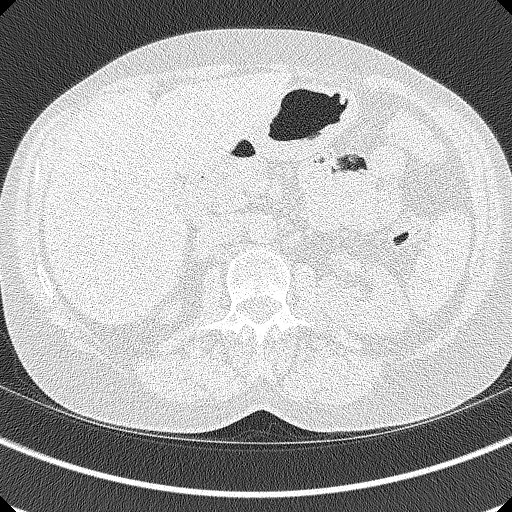
[im 45/329  lung]
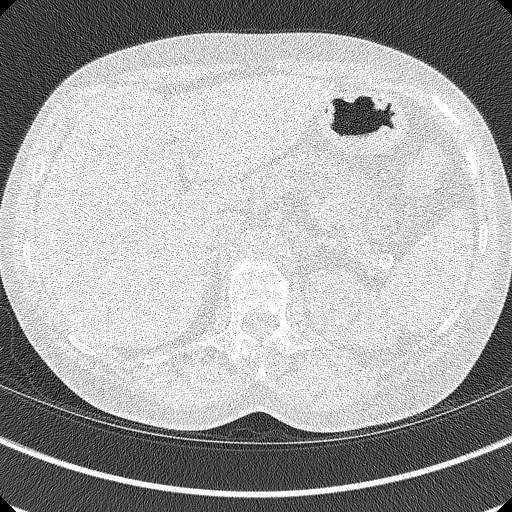
[im 75/329  lung]
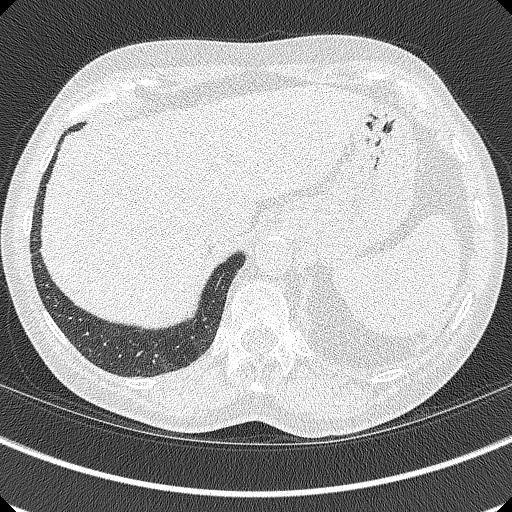
[im 105/329  lung]
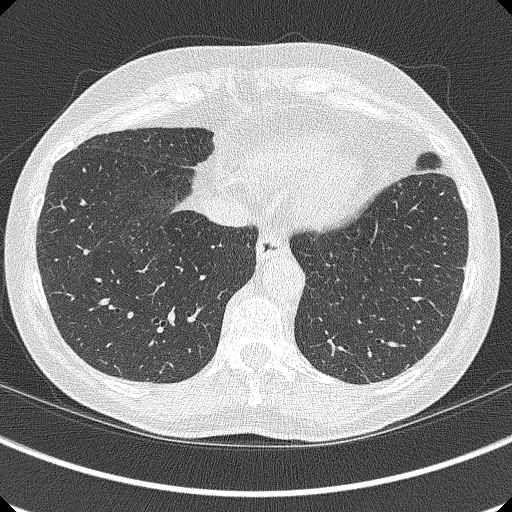
[im 135/329  mediastinal]
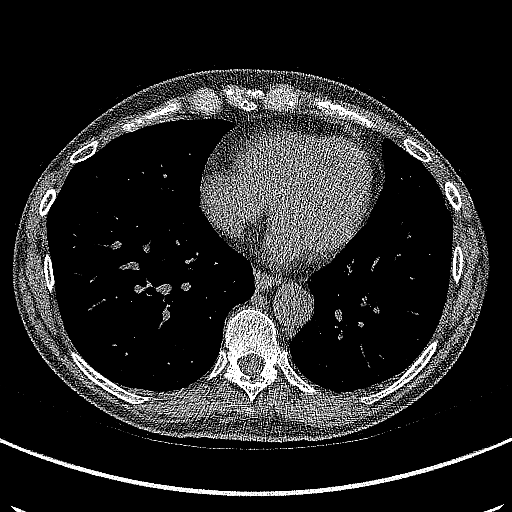
[im 135/329  lung]
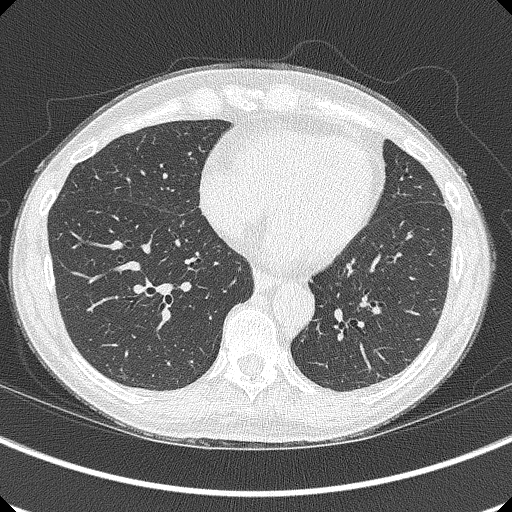
[im 165/329  lung]
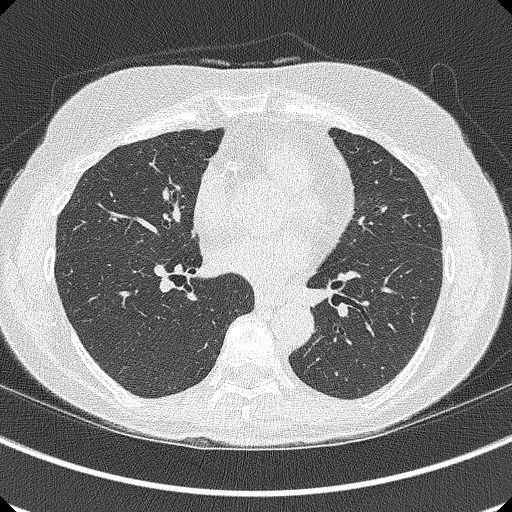
[im 194/329  lung]
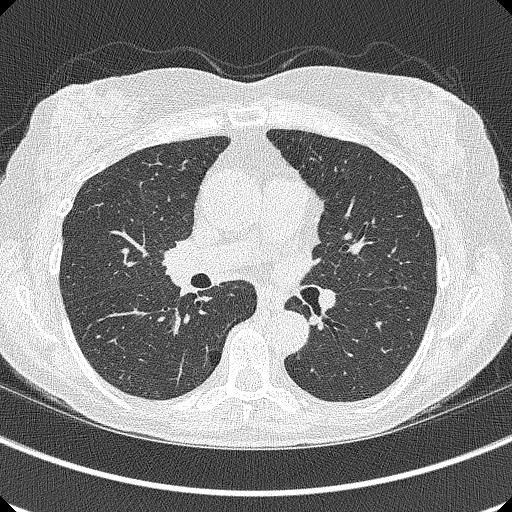
[im 224/329  lung]
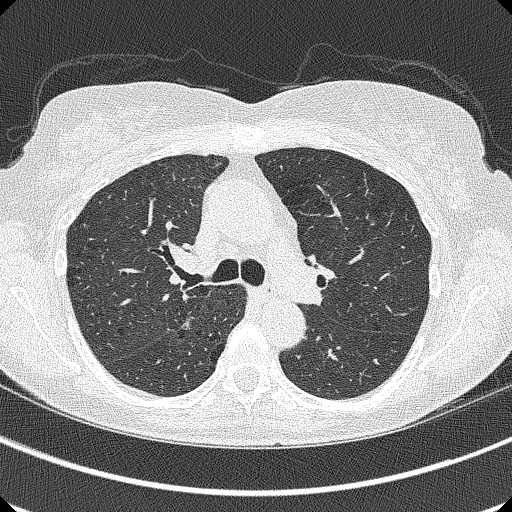
[im 254/329  mediastinal]
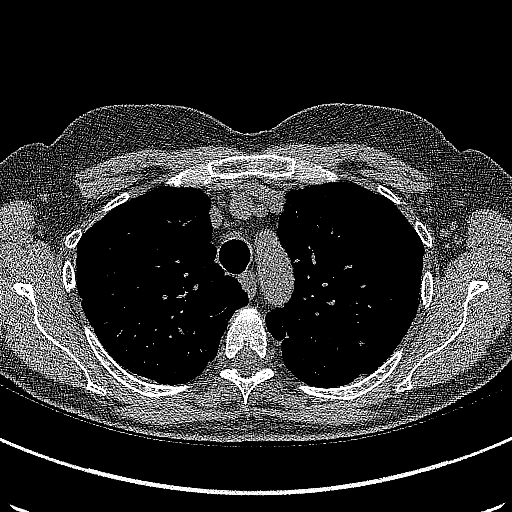
[im 254/329  lung]
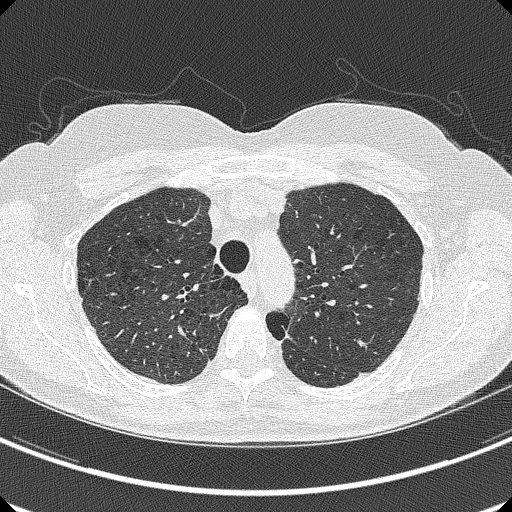
[im 284/329  lung]
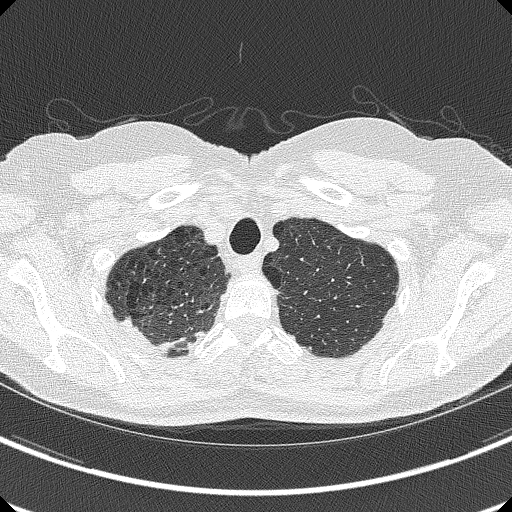
[im 314/329  lung]
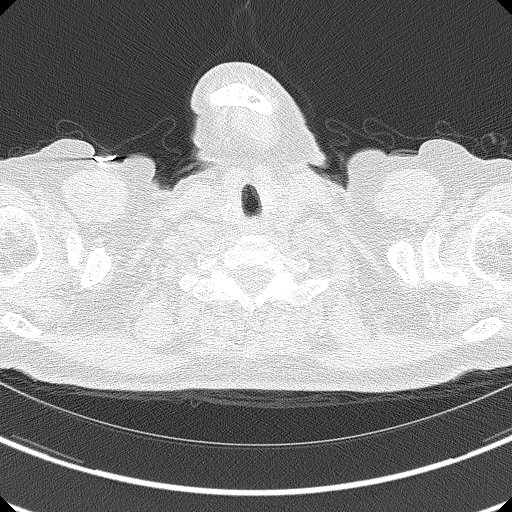

[Series 5: coronals lung 1.00 cor · coronal · 0.59mm/px · 3 of 245 slices shown]
[im 49/245  lung]
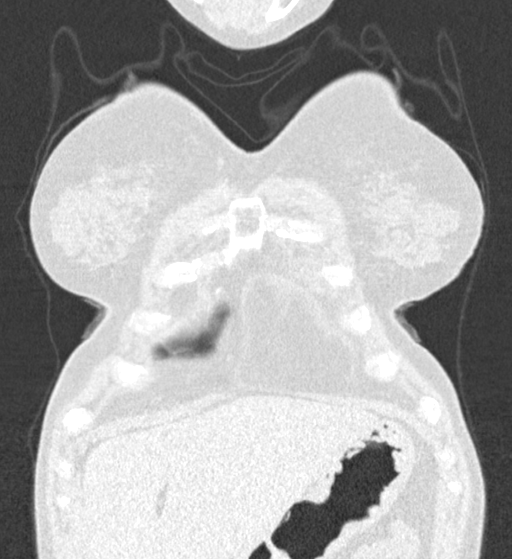
[im 98/245  lung]
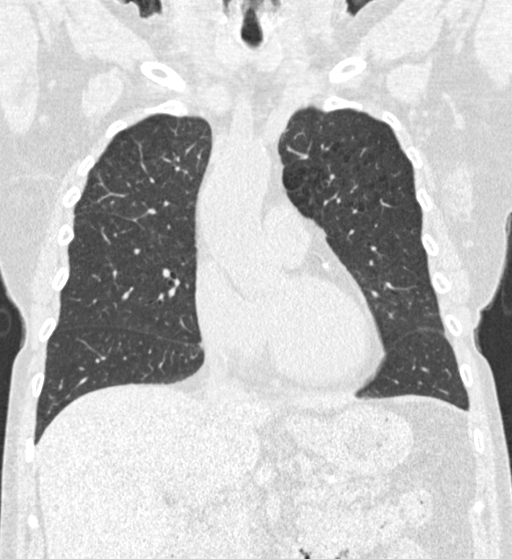
[im 147/245  lung]
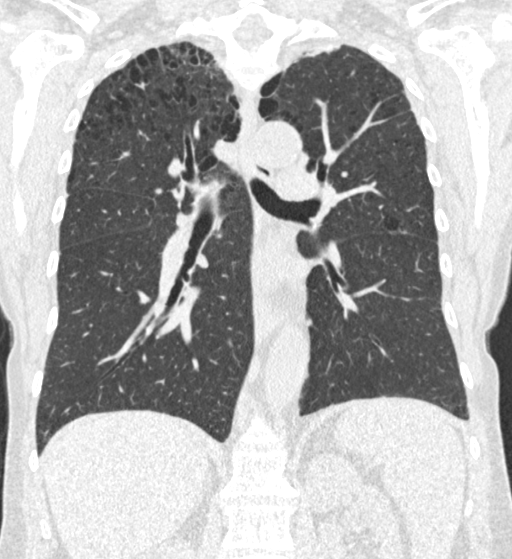

[14 of 40 positions shown; findings below may reference images not displayed]

FINDINGS: Cardiovascular: Heart size is normal. There is no significant
pericardial fluid, thickening or pericardial calcification. There is
aortic atherosclerosis, as well as atherosclerosis of the great
vessels of the mediastinum and the coronary arteries, including
calcified atherosclerotic plaque in the left anterior descending,
left circumflex and right coronary arteries.

Mediastinum/Nodes: Mediastinal or no pathologically enlarged hilar
lymph nodes. Please note that accurate exclusion of hilar adenopathy
is limited on noncontrast CT scans. Esophagus is unremarkable in
appearance. No axillary lymphadenopathy.

Lungs/Pleura: Previously noted areas of nodularity in the lungs have
completely resolved, indicative of a benign infectious or
inflammatory etiology on the prior study. No new suspicious
appearing pulmonary nodules or masses are noted on today's
examination. No acute consolidative airspace disease. No pleural
effusions. Scattered areas of mild scarring are noted in the lung
bases bilaterally (left-greater-than-right), similar to the prior
study. Mild bilateral apical nodular pleuroparenchymal thickening
and architectural distortion, most compatible with areas of chronic
post infectious or inflammatory scarring, stable. Diffuse bronchial
wall thickening with mild centrilobular and paraseptal emphysema.

Upper Abdomen: Aortic atherosclerosis.

Musculoskeletal: There are no aggressive appearing lytic or blastic
lesions noted in the visualized portions of the skeleton.
IMPRESSION: 1. Lung-RADS 1S, negative. Continue annual screening with low-dose
chest CT without contrast in 12 months.
2. The "S" modifier above refers to potentially clinically
significant non lung cancer related findings. Specifically, there is
aortic atherosclerosis, in addition to three-vessel coronary artery
disease. Please note that although the presence of coronary artery
calcium documents the presence of coronary artery disease, the
severity of this disease and any potential stenosis cannot be
assessed on this non-gated CT examination. Assessment for potential
risk factor modification, dietary therapy or pharmacologic therapy
may be warranted, if clinically indicated.
3. Mild diffuse bronchial wall thickening with mild centrilobular
and paraseptal emphysema; imaging findings suggestive of underlying
COPD.

Aortic Atherosclerosis (3XMLN-T38.8) and Emphysema (3XMLN-LNW.9).

## 2023-02-16 ENCOUNTER — Ambulatory Visit
Admission: RE | Admit: 2023-02-16 | Discharge: 2023-02-16 | Disposition: A | Discharge status: Home / Self Care | Payer: BC Managed Care – PPO | Source: Ambulatory Visit | Attending: Physician Assistant | Admitting: Physician Assistant

## 2023-02-16 DIAGNOSIS — F17210 Nicotine dependence, cigarettes, uncomplicated: Secondary | ICD-10-CM | POA: Insufficient documentation

## 2023-02-16 DIAGNOSIS — Z87891 Personal history of nicotine dependence: Secondary | ICD-10-CM | POA: Insufficient documentation

## 2023-02-18 ENCOUNTER — Other Ambulatory Visit: Payer: Self-pay | Admitting: Acute Care

## 2023-02-18 DIAGNOSIS — F17210 Nicotine dependence, cigarettes, uncomplicated: Secondary | ICD-10-CM

## 2023-02-18 DIAGNOSIS — Z87891 Personal history of nicotine dependence: Secondary | ICD-10-CM

## 2023-02-18 DIAGNOSIS — Z122 Encounter for screening for malignant neoplasm of respiratory organs: Secondary | ICD-10-CM

## 2023-11-04 ENCOUNTER — Encounter: Payer: Self-pay | Admitting: Internal Medicine

## 2023-11-04 ENCOUNTER — Ambulatory Visit (INDEPENDENT_AMBULATORY_CARE_PROVIDER_SITE_OTHER): Payer: BC Managed Care – PPO | Admitting: Internal Medicine

## 2023-11-04 VITALS — BP 120/62 | HR 108 | Temp 97.9°F | Ht 60.0 in | Wt 134.2 lb

## 2023-11-04 DIAGNOSIS — J9611 Chronic respiratory failure with hypoxia: Secondary | ICD-10-CM

## 2023-11-04 DIAGNOSIS — Z720 Tobacco use: Secondary | ICD-10-CM

## 2023-11-04 DIAGNOSIS — J449 Chronic obstructive pulmonary disease, unspecified: Secondary | ICD-10-CM | POA: Diagnosis not present

## 2023-11-04 DIAGNOSIS — R1310 Dysphagia, unspecified: Secondary | ICD-10-CM | POA: Diagnosis not present

## 2023-11-04 NOTE — Patient Instructions (Addendum)
PLEASE STOP SMOKING  REFERRAL TO GI DOCTOR FOR DIFFICULTY SWALLOWING  CONTINUE WITH SPIRIVA  CONTINUE ALBUTEROL AS NEEDED USE YOUR NEBULIZER AS NEEDED  FOLLOW UP CT SCANS WITH LUNG CANCER SCREENING  OBTAIN BREATHING TESTS TO ASSESS LUNG FUNCTION  CONTINUE OXYGEN AS PRESCRIBED  Avoid secondhand smoke Avoid SICK contacts Recommend  Masking  when appropriate Recommend Keep up-to-date with vaccinations

## 2023-11-04 NOTE — Progress Notes (Signed)
[facility] Consultation    SYNOPSIS  The patient is a 67 year old female with history of COPD.  He has had a possible allergic reaction to Symbicort in the past, asked to maintain on Spiriva.  Patient also has a history of OSA but did not start CPAP due to cost.  She is using spiriva once daily. She is still smoking about 5 per day,  She has been on chantix twice in the past and feels that it helped but does not want to try it again. She has smoked 1-1.5 ppd, started smoking at age of 58.  She is not interested in a referral to a dentist for possible dental device.   She has a history of TMJ.    Desat walk 01/01/18; on RA at rest her sat is 98% and HR 95. She walked 360 feet at brisk pace, moderate dyspena, Sat 95% and HR 101.  Imaging personally reviewed, chest x-ray 09/17/17, hyperinflation consistent with emphysema.  CT chest 04/09/12 moderate to severe changes of apical emphysema  Date: 11/04/2023  MRN# [id number] Emily Figueroa 1956-01-04    CC Follow-up assessment of COPD Follow-up assessment for chronic hypoxic respiratory failure Follow-up assessment for smoking   HPI No exacerbation at this time No evidence of heart failure at this time No evidence or signs of infection at this time No respiratory distress No fevers, chills, nausea, vomiting, diarrhea No evidence of lower extremity edema No evidence hemoptysis  Patient's main issue is having problems with swallowing Patient has issues with dysphagia with pills and food Patient does have a diagnosis of a hiatal hernia and GERD Currently on Prilosec Seems like this is contributing to her shortness of breath     Smoking Assessment and Cessation Counseling Upon further questioning, Patient smokes 1/2 ppd I have advised patient to quit/stop smoking as soon as possible due to high risk for multiple medical problems  Patient is NOT willing to quit smoking  I have advised patient that we can assist  and have options of Nicotine replacement therapy. I also advised patient on behavioral therapy and can provide oral medication therapy in conjunction with the other therapies Follow up next Office visit  for assessment of smoking cessation Smoking cessation counseling advised for >10 minutes  Although she does not have an exacerbation at this time her COPD does not seem to be well controlled Has increased work of breathing Patient would like to try alternative inhaler therapy   Regarding chronic oxygen therapy Patient uses oxygen at nighttime and as needed Continue as prescribed   Pulmonary function test 2020 +Airtrapping and +hyperinflation related to probable underlying small overactive airways disease Independently reviewed by me today 11/04/2023 FEV1 89% with BD admin    +DYSPHAGIA WITH PILLS AND FOOD   BP 120/62 (BP Location: Left Arm, Cuff Size: Normal)   Pulse (!) 108   Temp 97.9 F (36.6 C) (Temporal)   Ht 5' (1.524 m)   Wt 134 lb 3.2 oz (60.9 kg)   SpO2 100%   BMI 26.21 kg/m     Review of Systems: Gen:  Denies  fever, sweats, chills weight loss  HEENT: Denies blurred vision, double vision, ear pain, eye pain, hearing loss, nose bleeds, sore throat Cardiac:  No dizziness, chest pain or heaviness, chest tightness,edema, No JVD Resp:   No cough, -sputum production, +shortness of breath,+wheezing, -hemoptysis,  +DYSPHAGIA Other:  All other systems negative   Physical Examination:   General Appearance: No distress  EYES  PERRLA, EOM intact.   NECK Supple, No JVD Pulmonary: normal breath sounds, No wheezing.  CardiovascularNormal S1,S2.  No m/r/g.   Abdomen: Benign, Soft, non-tender. Neurology UE/LE 5/5 strength, no focal deficits Ext pulses intact, cap refill intact ALL OTHER ROS ARE NEGATIVE        Social Hx:   Social History   Tobacco Use   Smoking status: Every Day    Current packs/day: 1.00    Average packs/day: 1 pack/day for 47.0 years  (47.0 ttl pk-yrs)    Types: Cigarettes   Smokeless tobacco: Never   Tobacco comments:    3 cigs daily--09/25/2022  Vaping Use   Vaping status: Never Used  Substance Use Topics   Alcohol use: No   Drug use: No   Medication:    Current Outpatient Medications:    albuterol (VENTOLIN HFA) 108 (90 Base) MCG/ACT inhaler, Inhale 2 puffs into the lungs every 4 (four) hours as needed for wheezing or shortness of breath., Disp: 1 g, Rfl: 6   ALLEGRA ALLERGY 180 MG tablet, Take 180 mg by mouth daily., Disp: , Rfl:    aspirin EC 81 MG tablet, Take 1 tablet (81 mg total) by mouth daily. Swallow whole., Disp: 90 tablet, Rfl: 3   atorvastatin (LIPITOR) 40 MG tablet, Take 1 tablet (40 mg total) by mouth daily., Disp: 30 tablet, Rfl: 3   Budeson-Glycopyrrol-Formoterol (BREZTRI AEROSPHERE) 160-9-4.8 MCG/ACT AERO, Inhale 2 puffs into the lungs in the morning and at bedtime., Disp: 5.9 g, Rfl: 0   Budeson-Glycopyrrol-Formoterol (BREZTRI AEROSPHERE) 160-9-4.8 MCG/ACT AERO, Inhale 2 puffs into the lungs in the morning and at bedtime., Disp: 1 each, Rfl: 10   buPROPion ER (WELLBUTRIN SR) 100 MG 12 hr tablet, Take 100 mg by mouth 2 (two) times daily., Disp: , Rfl:    fluticasone (FLONASE) 50 MCG/ACT nasal spray, , Disp: , Rfl:    folic acid (FOLVITE) 1 MG tablet, Take 1 mg by mouth daily., Disp: , Rfl:    gabapentin (NEURONTIN) 100 MG capsule, , Disp: , Rfl:    lisinopril (ZESTRIL) 20 MG tablet, Take 20 mg by mouth daily., Disp: , Rfl:    montelukast (SINGULAIR) 10 MG tablet, , Disp: , Rfl:    omeprazole (PRILOSEC) 20 MG capsule, Take 20 mg by mouth daily., Disp: , Rfl:    promethazine-dextromethorphan (PROMETHAZINE-DM) 6.25-15 MG/5ML syrup, Take 5 mLs by mouth 3 (three) times daily as needed for cough. (Patient not taking: Reported on 09/25/2022), Disp: 140 mL, Rfl: 0   solifenacin (VESICARE) 5 MG tablet, Take 1 tablet (5 mg total) by mouth daily. (Patient not taking: Reported on 09/25/2022), Disp: 30 tablet,  Rfl: 1   Allergies:  Spiriva handihaler [tiotropium bromide monohydrate] and Symbicort [budesonide-formoterol fumarate]  CT chest 3/23 lung cancer screening protocol Extensive bilateral upper lobe predominant emphysema No significant nodules or masses seen   Assessment and Plan:  67 year old pleasant female seen today for follow-up assessment for 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 and progressive shortness of breath   COPD is not well-controlled at this time with Spiriva  Breztri added to her regimen at last office visit    Continue albuterol as needed Avoid secondhand smoke Avoid SICK contacts Recommend  Masking  when appropriate Recommend Keep up-to-date with vaccinations   Chronic Hypoxic resp failure due to COPD nocturnal hypoxia -Patient benefits from oxygen therapy 2L [place]  -recommend using oxygen as prescribed -patient needs this for survival  Tobacco abuser Follow-up lung cancer screening protocol CT of  the chest March 2024 lung cancer screening protocol reviewed in detail with patient Independent review of scans today 11/04/2023 Bilateral upper lobe predominant emphysematous changes No nodules or masses seen Follow-up CT as scheduled  Smoking cessation strongly advised   MEDICATION ADJUSTMENTS/LABS AND TESTS ORDERED: PLEASE STOP SMOKING REFERRAL TO GI DOCTOR FOR DIFFICULTY SWALLOWING CONTINUE WITH SPIRIVA  CONTINUE ALBUTEROL AS NEEDED USE YOUR NEBULIZER AS NEEDED FOLLOW UP CT SCANS WITH LUNG CANCER SCREENING OBTAIN BREATHING TESTS TO ASSESS LUNG FUNCTION CONTINUE OXYGEN AS PRESCRIBED Avoid secondhand smoke Avoid SICK contacts Recommend  Masking  when appropriate Recommend Keep up-to-date with vaccinations   CURRENT MEDICATIONS REVIEWED AT LENGTH WITH PATIENT TODAY   Patient satisfied with Plan of action and management. All questions answered  Follow-up in 6 months  Total time spent 42 minutes   Lucie Leather, M.D.  [facility]  Medical Director [facility] Medical Director [facility] Cardio-Pulmonary Department

## 2023-11-25 ENCOUNTER — Telehealth: Payer: Self-pay | Admitting: Internal Medicine

## 2023-11-25 NOTE — Telephone Encounter (Signed)
I have faxed the referral to [facility] per patient's request

## 2023-11-25 NOTE — Telephone Encounter (Signed)
Pt would like her referral sent to [facility] clinic gastro instead of [facility]

## 2023-12-02 NOTE — Telephone Encounter (Signed)
I called and spoke with Aundra Millet with [facility] and she confirmed they have this referral and the patient has an appt with Dr. Norma Fredrickson on 03/24/2024 @ 11:00

## 2024-02-18 ENCOUNTER — Other Ambulatory Visit: Payer: Self-pay | Admitting: Acute Care

## 2024-02-18 DIAGNOSIS — Z87891 Personal history of nicotine dependence: Secondary | ICD-10-CM

## 2024-02-18 DIAGNOSIS — Z122 Encounter for screening for malignant neoplasm of respiratory organs: Secondary | ICD-10-CM

## 2024-02-18 DIAGNOSIS — F17210 Nicotine dependence, cigarettes, uncomplicated: Secondary | ICD-10-CM

## 2024-02-29 ENCOUNTER — Ambulatory Visit
Admission: RE | Admit: 2024-02-29 | Discharge: 2024-02-29 | Disposition: A | Discharge status: Home / Self Care | Source: Ambulatory Visit | Attending: Acute Care | Admitting: Acute Care

## 2024-02-29 DIAGNOSIS — F17210 Nicotine dependence, cigarettes, uncomplicated: Secondary | ICD-10-CM | POA: Insufficient documentation

## 2024-02-29 DIAGNOSIS — Z122 Encounter for screening for malignant neoplasm of respiratory organs: Secondary | ICD-10-CM | POA: Insufficient documentation

## 2024-02-29 DIAGNOSIS — Z87891 Personal history of nicotine dependence: Secondary | ICD-10-CM | POA: Insufficient documentation

## 2024-03-29 ENCOUNTER — Other Ambulatory Visit: Payer: Self-pay

## 2024-03-29 DIAGNOSIS — Z87891 Personal history of nicotine dependence: Secondary | ICD-10-CM

## 2024-03-29 DIAGNOSIS — Z122 Encounter for screening for malignant neoplasm of respiratory organs: Secondary | ICD-10-CM

## 2024-03-29 DIAGNOSIS — F17210 Nicotine dependence, cigarettes, uncomplicated: Secondary | ICD-10-CM

## 2024-04-05 ENCOUNTER — Encounter: Payer: Self-pay | Admitting: Emergency Medicine

## 2024-04-05 ENCOUNTER — Emergency Department
Admission: EM | Admit: 2024-04-05 | Discharge: 2024-04-05 | Disposition: A | Discharge status: Home / Self Care | Attending: Emergency Medicine | Admitting: Emergency Medicine

## 2024-04-05 ENCOUNTER — Other Ambulatory Visit: Payer: Self-pay

## 2024-04-05 ENCOUNTER — Emergency Department

## 2024-04-05 DIAGNOSIS — N390 Urinary tract infection, site not specified: Secondary | ICD-10-CM | POA: Insufficient documentation

## 2024-04-05 DIAGNOSIS — J449 Chronic obstructive pulmonary disease, unspecified: Secondary | ICD-10-CM | POA: Insufficient documentation

## 2024-04-05 DIAGNOSIS — I10 Essential (primary) hypertension: Secondary | ICD-10-CM | POA: Insufficient documentation

## 2024-04-05 DIAGNOSIS — R1084 Generalized abdominal pain: Secondary | ICD-10-CM | POA: Diagnosis present

## 2024-04-05 DIAGNOSIS — R110 Nausea: Secondary | ICD-10-CM

## 2024-04-05 DIAGNOSIS — R1030 Lower abdominal pain, unspecified: Secondary | ICD-10-CM

## 2024-04-05 LAB — CBC
HCT: 35.1 % — ABNORMAL LOW (ref 36.0–46.0)
Hemoglobin: 12.2 g/dL (ref 12.0–15.0)
MCH: 30 pg (ref 26.0–34.0)
MCHC: 34.8 g/dL (ref 30.0–36.0)
MCV: 86.2 fL (ref 80.0–100.0)
Platelets: 211 10*3/uL (ref 150–400)
RBC: 4.07 MIL/uL (ref 3.87–5.11)
RDW: 12 % (ref 11.5–15.5)
WBC: 14.6 10*3/uL — ABNORMAL HIGH (ref 4.0–10.5)
nRBC: 0 % (ref 0.0–0.2)

## 2024-04-05 LAB — COMPREHENSIVE METABOLIC PANEL WITH GFR
ALT: 19 U/L (ref 0–44)
AST: 17 U/L (ref 15–41)
Albumin: 3.6 g/dL (ref 3.5–5.0)
Alkaline Phosphatase: 73 U/L (ref 38–126)
Anion gap: 13 (ref 5–15)
BUN: 12 mg/dL (ref 8–23)
CO2: 21 mmol/L — ABNORMAL LOW (ref 22–32)
Calcium: 8.8 mg/dL — ABNORMAL LOW (ref 8.9–10.3)
Chloride: 99 mmol/L (ref 98–111)
Creatinine, Ser: 0.75 mg/dL (ref 0.44–1.00)
GFR, Estimated: >60 mL/min (ref >60)
Glucose, Bld: 137 mg/dL — ABNORMAL HIGH (ref 70–99)
Potassium: 3.3 mmol/L — ABNORMAL LOW (ref 3.5–5.1)
Sodium: 133 mmol/L — ABNORMAL LOW (ref 135–145)
Total Bilirubin: 1 mg/dL (ref 0.0–1.2)
Total Protein: 7.2 g/dL (ref 6.5–8.1)

## 2024-04-05 LAB — URINALYSIS, ROUTINE W REFLEX MICROSCOPIC
Bilirubin Urine: NEGATIVE
Glucose, UA: NEGATIVE mg/dL
Ketones, ur: NEGATIVE mg/dL
Nitrite: POSITIVE — AB
Protein, ur: 100 mg/dL — AB
Specific Gravity, Urine: 1.02 (ref 1.005–1.030)
WBC, UA: >50 WBC/hpf (ref 0–5)
pH: 5 (ref 5.0–8.0)

## 2024-04-05 LAB — LIPASE, BLOOD: Lipase: 24 U/L (ref 11–51)

## 2024-04-05 MED ORDER — MORPHINE SULFATE (PF) 4 MG/ML IV SOLN
4.0000 mg | Freq: Once | INTRAVENOUS | Status: AC
  Administered 2024-04-05: 4 mg via INTRAVENOUS
  Filled 2024-04-05: qty 1

## 2024-04-05 MED ORDER — CEFUROXIME AXETIL 250 MG PO TABS: 250.0000 mg | ORAL_TABLET | Freq: Two times a day (BID) | ORAL | 0 refills | Status: AC

## 2024-04-05 MED ORDER — SODIUM CHLORIDE 0.9 % IV SOLN
1.0000 g | Freq: Once | INTRAVENOUS | Status: AC
  Administered 2024-04-05: 1 g via INTRAVENOUS
  Filled 2024-04-05: qty 10

## 2024-04-05 MED ORDER — IOHEXOL 300 MG/ML  SOLN
80.0000 mL | Freq: Once | INTRAMUSCULAR | Status: AC | PRN
  Administered 2024-04-05: 80 mL via INTRAVENOUS

## 2024-04-05 MED ORDER — ONDANSETRON 8 MG PO TBDP: 8.0000 mg | ORAL_TABLET | Freq: Three times a day (TID) | ORAL | 0 refills | Status: AC | PRN

## 2024-04-05 MED ORDER — LACTATED RINGERS IV BOLUS
1000.0000 mL | Freq: Once | INTRAVENOUS | Status: AC
  Administered 2024-04-05: 1000 mL via INTRAVENOUS

## 2024-04-05 MED ORDER — ONDANSETRON HCL 4 MG/2ML IJ SOLN
4.0000 mg | Freq: Once | INTRAMUSCULAR | Status: AC
  Administered 2024-04-05: 4 mg via INTRAVENOUS
  Filled 2024-04-05: qty 2

## 2024-04-05 NOTE — ED Triage Notes (Signed)
 Patient to ED via POV for generalized abd pain. Pt reports ongoing since Aug. Saw PCP and sent her to ED for CT. States having N/V/D with lack of appetite.

## 2024-04-05 NOTE — ED Provider Notes (Signed)
 [facility] Provider Note   Event Date/Time   First MD Initiated Contact with Patient 04/05/24 1023     (approximate) History  Abdominal Pain  HPI Emily Figueroa is a 68 y.o. female with a past medical history of COPD, hypertension, chronic abdominal pain, and lymphocytic colitis who presents complaining of of intermittent generalized abdominal pain with nausea/vomiting/diarrhea and lack of appetite that has been worsening over the last week.  Patient states that the symptoms have been ongoing since last August.  Patient states that since having a viral infection approximately 1 week prior to arrival she has not stopped having fevers with temperatures at home to 101.  Patient locates this pain in the epigastric region as well as bilateral lower abdominal quadrants. ROS: Patient currently denies any vision changes, tinnitus, difficulty speaking, facial droop, sore throat, chest pain, shortness of breath, dysuria, or weakness/numbness/paresthesias in any extremity   Physical Exam  Triage Vital Signs: ED Triage Vitals  Encounter Vitals Group     BP 04/05/24 1010 112/77     Systolic BP Percentile --      Diastolic BP Percentile --      Pulse Rate 04/05/24 1010 (!) 107     Resp 04/05/24 1010 17     Temp 04/05/24 1012 100.1 F (37.8 C)     Temp Source 04/05/24 1012 Oral     SpO2 04/05/24 1010 97 %     Weight 04/05/24 1011 136 lb (61.7 kg)     Height 04/05/24 1011 5' (1.524 m)     Head Circumference --      Peak Flow --      Pain Score 04/05/24 1010 10     Pain Loc --      Pain Education --      Exclude from Growth Chart --    Most recent vital signs: Vitals:   04/05/24 1010 04/05/24 1012  BP: 112/77   Pulse: (!) 107   Resp: 17   Temp:  100.1 F (37.8 C)  SpO2: 97%    General: Awake, oriented x4. CV:  Good peripheral perfusion.  Resp:  Normal effort.  Abd:  No distention.  Midepigastric tenderness to palpation Other:  Middle-aged overweight  Caucasian female resting on stretcher in moderate distress ED Results / Procedures / Treatments  Labs (all labs ordered are listed, but only abnormal results are displayed) Labs Reviewed  COMPREHENSIVE METABOLIC PANEL WITH GFR - Abnormal; Notable for the following components:      Result Value   Sodium 133 (*)    Potassium 3.3 (*)    CO2 21 (*)    Glucose, Bld 137 (*)    Calcium  8.8 (*)    All other components within normal limits  CBC - Abnormal; Notable for the following components:   WBC 14.6 (*)    HCT 35.1 (*)    All other components within normal limits  URINALYSIS, ROUTINE W REFLEX MICROSCOPIC - Abnormal; Notable for the following components:   Color, Urine AMBER (*)    APPearance CLOUDY (*)    Hgb urine dipstick MODERATE (*)    Protein, ur 100 (*)    Nitrite POSITIVE (*)    Leukocytes,Ua LARGE (*)    Bacteria, UA MANY (*)    All other components within normal limits  LIPASE, BLOOD   RADIOLOGY ED MD interpretation: CT of the abdomen and pelvis with IV contrast independently interpreted and shows a small nodular enhancing soft tissue density at  the dome of the bladder measuring 9 x 13 mm.  There is also mild dilation of bilateral renal pelvis and proximal ureter.  There is also very subtle fat stranding around the tail of the pancreas inferiorly -Agree with radiology assessment Official radiology report(s): CT ABDOMEN PELVIS W CONTRAST Result Date: 04/05/2024 CLINICAL DATA:  Abdominal pain and history of lymphocytic colitis EXAM: CT ABDOMEN AND PELVIS WITH CONTRAST TECHNIQUE: Multidetector CT imaging of the abdomen and pelvis was performed using the standard protocol following bolus administration of intravenous contrast. RADIATION DOSE REDUCTION: This exam was performed according to the departmental dose-optimization program which includes automated exposure control, adjustment of the mA and/or kV according to patient size and/or use of iterative reconstruction technique.  CONTRAST:  80mL OMNIPAQUE  IOHEXOL  300 MG/ML  SOLN COMPARISON:  August 06, 2020 CT abdomen and pelvis FINDINGS: Lower chest: No infiltrates or consolidations, no pleural effusions Hepatobiliary: Liver normal size no masses no biliary dilatation. Gallbladder unremarkable. No gallstones. Pancreas: Pancreas normal size. No masses calcifications or inflammatory changes. However there seems to be very subtle stranding of the fat surrounding the tail of the pancreas inferiorly better seen on coronal images. Correlate clinically to rule out the possibility of mild early pancreatitis Spleen: Spleen normal size. No masses. 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 Follow-up recommended. Kidneys are normal. No masses calcifications. There is mild dilatation of both renal pelvis and proximal ureter, ureters can be followed down to the bladder appears normal size trajectory without evidence of masses or calcifications. Small nodular enhancing soft tissue density in the dome of the bladder measuring 9 x 13 mm refer to sagittal image 55 and axial image 60, coronal image 31. The possibility of a bladder neoplastic lesion should be considered evaluation by bladder ultrasound recommended however, cystoscopy and urology consultation is recommended as well. Stomach/Bowel: No small or large bowel obstruction or inflammatory changes. Moderate amount of residual fecal material throughout the colon without obstruction or constipation. Vascular/Lymphatic: Aortic atherosclerosis. No enlarged abdominal or pelvic lymph nodes. Reproductive: Uterus and bilateral adnexa are unremarkable. Other: Small anterior abdominal wall epigastric supraumbilical hernia defects containing fat only. Musculoskeletal: Visualized portion of the thoracolumbar spine and pelvic structures grossly unremarkable without evidence of fracture bony abnormalities or soft tissue masses. IMPRESSION: *Small nodular  enhancing soft tissue density in the dome of the bladder measuring 9 x 13 mm. The possibility of a bladder neoplastic lesion should be considered evaluation by bladder ultrasound recommended however, cystoscopy and Urology consultation is recommended as well. *Mild dilatation of both renal pelvis and proximal ureter, ureters can be followed down to the bladder appears normal size trajectory without evidence of masses or calcifications. *Very subtle stranding of the fat surrounding the tail of the pancreas inferiorly better seen on coronal images. Correlate clinically to exclude the possibility of mild early pancreatitis. *Small anterior abdominal wall epigastric supraumbilical hernia defects containing fat only. *Aortic atherosclerosis. Abdominal pain history of lymphocytic colitis Electronically Signed   By: Fredrich Jefferson M.D.   On: 04/05/2024 13:52   PROCEDURES: Critical Care performed: No Procedures MEDICATIONS ORDERED IN ED: Medications  morphine  (PF) 4 MG/ML injection 4 mg (4 mg Intravenous Given 04/05/24 1125)  ondansetron  (ZOFRAN ) injection 4 mg (4 mg Intravenous Given 04/05/24 1126)  lactated ringers  bolus 1,000 mL (1,000 mLs Intravenous New Bag/Given 04/05/24 1126)  cefTRIAXone  (ROCEPHIN ) 1 g in sodium chloride  0.9 % 100 mL IVPB (1 g Intravenous New Bag/Given 04/05/24 1126)  iohexol  (  OMNIPAQUE ) 300 MG/ML solution 80 mL (80 mLs Intravenous Contrast Given 04/05/24 1152)   IMPRESSION / MDM / ASSESSMENT AND PLAN / ED COURSE  I reviewed the triage vital signs and the nursing notes.                             The patient is on the cardiac monitor to evaluate for evidence of arrhythmia and/or significant heart rate changes. Patient's presentation is most consistent with acute presentation with potential threat to life or bodily function. Not Pregnant. Unlikely TOA, Ovarian Torsion, PID, gonorrhea/chlamydia. Low suspicion for Infected Urolithiasis, AAA, Cholecystitis, Pancreatitis, SBO,  Appendicitis, or other acute abdomen. Patient informed about bladder mass as well as lesion in pancreas.  Patient is p.o. tolerant and stable for discharge Rx: Ceftin  BID for 5 days Disposition: Discharge home. SRP discussed. Advise follow up with primary care provider within 24-72 hours.   FINAL CLINICAL IMPRESSION(S) / ED DIAGNOSES   Final diagnoses:  Urinary tract infection with hematuria, site unspecified  Lower abdominal pain  Nausea   Rx / DC Orders   ED Discharge Orders          Ordered    cefUROXime  (CEFTIN ) 250 MG tablet  2 times daily with meals        04/05/24 1413    ondansetron  (ZOFRAN -ODT) 8 MG disintegrating tablet  Every 8 hours PRN        04/05/24 1413           Note:  This document was prepared using Dragon voice recognition software and may include unintentional dictation errors.   Charleen Conn, MD 04/05/24 [phone]

## 2024-04-08 ENCOUNTER — Telehealth: Admitting: Nurse Practitioner

## 2024-04-08 ENCOUNTER — Encounter: Payer: Self-pay | Admitting: Nurse Practitioner

## 2024-04-08 DIAGNOSIS — R1310 Dysphagia, unspecified: Secondary | ICD-10-CM

## 2024-04-08 DIAGNOSIS — J449 Chronic obstructive pulmonary disease, unspecified: Secondary | ICD-10-CM | POA: Diagnosis not present

## 2024-04-08 DIAGNOSIS — J302 Other seasonal allergic rhinitis: Secondary | ICD-10-CM | POA: Diagnosis not present

## 2024-04-08 DIAGNOSIS — K219 Gastro-esophageal reflux disease without esophagitis: Secondary | ICD-10-CM

## 2024-04-08 DIAGNOSIS — J309 Allergic rhinitis, unspecified: Secondary | ICD-10-CM | POA: Insufficient documentation

## 2024-04-08 DIAGNOSIS — R053 Chronic cough: Secondary | ICD-10-CM | POA: Diagnosis not present

## 2024-04-08 DIAGNOSIS — G4734 Idiopathic sleep related nonobstructive alveolar hypoventilation: Secondary | ICD-10-CM

## 2024-04-08 DIAGNOSIS — F17200 Nicotine dependence, unspecified, uncomplicated: Secondary | ICD-10-CM

## 2024-04-08 MED ORDER — FLUTICASONE PROPIONATE 50 MCG/ACT NA SUSP: 2.0000 | Freq: Every day | NASAL | 2 refills | Status: DC

## 2024-04-08 MED ORDER — FLUTICASONE-SALMETEROL 115-21 MCG/ACT IN AERO
2.0000 | INHALATION_SPRAY | Freq: Two times a day (BID) | RESPIRATORY_TRACT | 12 refills | Status: AC
Start: 2024-04-08 — End: ?

## 2024-04-08 NOTE — Assessment & Plan Note (Signed)
 COPD, not in acute exacerbation. Still does not seem to be well controlled on current regimen. Suspect she would benefit from step up in therapy but Breztri  was expensive. She does like Spiriva  and prefers to continue this. Will add on Advair 115 mcg 2 puffs Twice daily. Side effect profile reviewed. Educated on proper use. Encouraged to remain smoke free. Action plan in place.  Patient Instructions  Continue Spiriva  2 puffs daily. Restart allegra daily Restart flonase 2 sprays each nostril daily Continue omeprazole 1 tab daily for reflux  Continue supplemental oxygen 2 L/min at night and as needed with activity for goal greater than 88 to 90%  Start Advair 2 puffs Twice daily. Brush tongue and rinse mouth afterwards  Attend lung cancer screening CT next March 2026 with lung cancer screening program  Follow up with GI as scheduled   Talk to your primary care doctor about switching your lisinopril to a different blood pressure medication. Lisinopril is known for causing a cough, which could be the cause of or contributing to your's   Follow up in 6 weeks to see how Advair is working with Dr. Auston Left or Gina Lagos. If symptoms do not improve or worsen, please contact office for sooner follow up or seek emergency care.

## 2024-04-08 NOTE — Patient Instructions (Addendum)
 Continue Spiriva  2 puffs daily. Restart allegra daily Restart flonase 2 sprays each nostril daily Continue omeprazole 1 tab daily for reflux  Continue supplemental oxygen 2 L/min at night and as needed with activity for goal greater than 88 to 90%  Start Advair 2 puffs Twice daily. Brush tongue and rinse mouth afterwards  Attend lung cancer screening CT next March 2026 with lung cancer screening program  Follow up with GI as scheduled   Talk to your primary care doctor about switching your lisinopril to a different blood pressure medication. Lisinopril is known for causing a cough, which could be the cause of or contributing to your's   Follow up in 6 weeks to see how Advair is working with Dr. Auston Left or Gina Lagos. If symptoms do not improve or worsen, please contact office for sooner follow up or seek emergency care.

## 2024-04-08 NOTE — Assessment & Plan Note (Signed)
 See above. Continue current PPI.

## 2024-04-08 NOTE — Assessment & Plan Note (Signed)
 Continue supplemental oxygen 1 lpm at night. Goal >88-90%

## 2024-04-08 NOTE — Assessment & Plan Note (Signed)
 Awaiting workup with GI.

## 2024-04-08 NOTE — Assessment & Plan Note (Signed)
 Advised to resume allergy regimen with non drowsy daily antihistamine and intranasal steroid.

## 2024-04-08 NOTE — Progress Notes (Signed)
 Patient ID: Emily Figueroa, female     DOB: 03-25-1956, 68 y.o.      MRN: [id number]  No chief complaint on file.   Virtual Visit via Video Note  I connected with Emily Figueroa on 04/08/24 at  2:30 PM EDT by a video enabled telemedicine application and verified that I am speaking with the correct person using two identifiers.  Location: Patient: Home Provider: Office    I discussed the limitations of evaluation and management by telemedicine and the availability of in person appointments. The patient expressed understanding and agreed to proceed.  History of Present Illness: 68 year old female, former smoker followed for COPD and chronic respiratory failure.  She is patient Dr. Landa Pine and last seen in office 11/04/2023.  Past medical history significant for OSA not on CPAP, GERD, allergic rhinitis, hypertension, history of Bell's palsy, arthritis.  TESTS/EVENTS: 10/25/2019 PFT: FVC 84, FEV1 89, ratio 78, TLC 101, DLCO 69.  No BD 02/29/2024 LDCT chest lung cancer screening: Severe atherosclerosis.  Emphysematous changes.  Stable moderate biapical scarring and left basilar scarring.  Lung RADS 1  11/04/2023: OV with Dr. Auston Left.  Using Spiriva  once daily.  Still smoking about 5 cigarettes a day.  Has been on Chantix in the past and felt that it helped.  Does have a history of OSA but did not start CPAP due to cost.  Not interested in oral device.  Has a history of TMJ.  No acute exacerbation of her breathing issues.  Having problems with swallowing.  Has dysphagia with pills and food.  Does have a diagnosis of hiatal hernia and GERD.  currently on Prilosec.  Does seem to contribute to her shortness of breath.  Smoking cessation advised.  COPD does not seem to well-controlled although not having an acute exacerbation.  Will step her up to Breztri  and reassess response.  Continue supplemental oxygen at night and as needed with exertion.  04/08/2024: Today -follow-up Discussed the use of AI scribe  software for clinical note transcription with the patient, who gave verbal consent to proceed.  History of Present Illness   Emily Figueroa is a 68 year old female with COPD and chronic respiratory failure for follow-up.  She experiences ongoing breathing difficulties and cough. She was previously prescribed Breztri  but was unable to use it effectively due to lack of instruction from the pharmacy.  Felt like she could not figure out how to properly work the inhaler.  She continues to use Spiriva  and does feel like it helps her. There has been no significant changes in her breathing since her last visit.  She quit smoking a couple of weeks ago, which she describes as a significant personal accomplishment.   She experiences a cough that sometimes leads to choking, especially when eating, and describes the sensation of phlegm in her throat. This issue has persisted since August, with notable difficulty since November. She has been referred to a gastroenterologist and is scheduled for an upper endoscopy and colonoscopy in June, after a long wait since November.  Symptoms have been stable since she last saw GI.  No sputum production with her cough.  No significant wheezing.  No fevers, chills, hemoptysis.  No issues with lower extremity swelling.  She has not been using nasal sprays or medications like Singulair and Allegra recently.  She occasionally uses her rescue inhaler.  She is on lisinopril for hypertension.  Has not ever been tried off of this to address her cough.  Wearing  her oxygen at night.  No increased oxygen requirements.  Last lung cancer screening CT without any suspicious nodules or masses.  She does have a spot on her bladder that her PCP has ordered further workup for.  No hematuria.        Allergies  Allergen Reactions   Spiriva  Handihaler [Tiotropium Bromide  Monohydrate] Hives   Symbicort [Budesonide-Formoterol Fumarate] Hives   Immunization History  Administered  Date(s) Administered   19-influenza Whole 08/08/2019   Hep A / Hep B 10/18/2015, 11/19/2015, 04/21/2016   Hepatitis B, ADULT 12/16/2015   Influenza Split 09/27/2015, 12/16/2015, 09/18/2016, 09/14/2017   Influenza,inj,Quad PF,6+ Mos 09/15/2017, 09/11/2020, 09/16/2021   Influenza,inj,quad, With Preservative 10/21/2018, 09/20/2019   Influenza-Unspecified 10/15/2020   Pneumococcal Polysaccharide-23 10/15/2017   Past Medical History:  Diagnosis Date   Abdominal pain, generalized    Arthritis    Bell's palsy    Chicken pox    COPD (chronic obstructive pulmonary disease) (HCC)    Diarrhea    Dysphagia    GERD (gastroesophageal reflux disease)    Hypertension    Weight loss     Tobacco History: Social History   Tobacco Use  Smoking Status Former   Current packs/day: 0.00   Average packs/day: 1 pack/day for 47.0 years (47.0 ttl pk-yrs)   Types: Cigarettes   Quit date: 03/15/2024   Years since quitting: 0.0  Smokeless Tobacco Never  Tobacco Comments   1 cig daily--11/04/2023   Counseling given: Not Answered Tobacco comments: 1 cig daily--11/04/2023   Outpatient Medications Prior to Visit  Medication Sig Dispense Refill   albuterol  (VENTOLIN  HFA) 108 (90 Base) MCG/ACT inhaler Inhale 2 puffs into the lungs every 4 (four) hours as needed for wheezing or shortness of breath. 1 g 6   ALLEGRA ALLERGY 180 MG tablet Take 180 mg by mouth daily.     aspirin  EC 81 MG tablet Take 1 tablet (81 mg total) by mouth daily. Swallow whole. 90 tablet 3   atorvastatin  (LIPITOR) 40 MG tablet Take 1 tablet (40 mg total) by mouth daily. 30 tablet 3   buPROPion ER (WELLBUTRIN SR) 100 MG 12 hr tablet Take 100 mg by mouth 2 (two) times daily. (Patient not taking: Reported on 11/04/2023)     cefUROXime  (CEFTIN ) 250 MG tablet Take 1 tablet (250 mg total) by mouth 2 (two) times daily with a meal. 14 tablet 0   folic acid (FOLVITE) 1 MG tablet Take 1 mg by mouth daily.     gabapentin (NEURONTIN) 100 MG  capsule      lisinopril (ZESTRIL) 20 MG tablet Take 20 mg by mouth daily.     montelukast (SINGULAIR) 10 MG tablet      omeprazole (PRILOSEC) 20 MG capsule Take 20 mg by mouth daily.     ondansetron  (ZOFRAN -ODT) 8 MG disintegrating tablet Take 1 tablet (8 mg total) by mouth every 8 (eight) hours as needed for nausea or vomiting. 20 tablet 0   promethazine -dextromethorphan (PROMETHAZINE -DM) 6.25-15 MG/5ML syrup Take 5 mLs by mouth 3 (three) times daily as needed for cough. 140 mL 0   solifenacin  (VESICARE ) 5 MG tablet Take 1 tablet (5 mg total) by mouth daily. 30 tablet 1   SPIRIVA  HANDIHALER 18 MCG inhalation capsule Place 18 mcg into inhaler and inhale daily.     fluticasone (FLONASE) 50 MCG/ACT nasal spray      No facility-administered medications prior to visit.     Review of Systems:   Constitutional: No weight loss or gain,  night sweats, fevers, chills, fatigue, or lassitude. HEENT: No headaches, tooth/dental problems, or sore throat. +allergy seasons, difficulties swallowing  CV:  No chest pain, orthopnea, PND, swelling in lower extremities, anasarca, dizziness, palpitations, syncope Resp: + shortness of breath with exertion (baseline); chronic cough. No excess mucus or change in color of mucus. No hemoptysis. No wheezing.  No chest wall deformity GI:  +heartburn, indigestion, abdominal pain. No nausea, vomiting, diarrhea, change in bowel habits, loss of appetite, bloody stools.  GU: No dysuria, change in color of urine, urgency or frequency.   Skin: No rash, lesions, ulcerations MSK:  No joint pain or swelling.   Neuro: No dizziness or lightheadedness.  Psych: No depression or anxiety. Mood stable.   Observations/Objective: Patient is well-developed, well-nourished in no acute distress. Chronically ill appearing.  Resting comfortably at home.  No labored breathing.  Speech is clear and coherent with logical content.  Patient is alert and oriented at baseline.   Assessment and  Plan: COPD (chronic obstructive pulmonary disease) (HCC) COPD, not in acute exacerbation. Still does not seem to be well controlled on current regimen. Suspect she would benefit from step up in therapy but Breztri  was expensive. She does like Spiriva  and prefers to continue this. Will add on Advair 115 mcg 2 puffs Twice daily. Side effect profile reviewed. Educated on proper use. Encouraged to remain smoke free. Action plan in place.  Patient Instructions  Continue Spiriva  2 puffs daily. Restart allegra daily Restart flonase 2 sprays each nostril daily Continue omeprazole 1 tab daily for reflux  Continue supplemental oxygen 2 L/min at night and as needed with activity for goal greater than 88 to 90%  Start Advair 2 puffs Twice daily. Brush tongue and rinse mouth afterwards  Attend lung cancer screening CT next March 2026 with lung cancer screening program  Follow up with GI as scheduled   Talk to your primary care doctor about switching your lisinopril to a different blood pressure medication. Lisinopril is known for causing a cough, which could be the cause of or contributing to your's   Follow up in 6 weeks to see how Advair is working with Dr. Auston Left or Gina Lagos. If symptoms do not improve or worsen, please contact office for sooner follow up or seek emergency care.    Chronic cough Multifactorial likely due to reflux, smoking history, postnasal drainage. Possible lisinopril is a factor as well. Advised her to discuss changing to alternative antihypertensive and reassess response.   Dysphagia, unspecified Awaiting workup with GI.  GERD (gastroesophageal reflux disease) See above. Continue current PPI.   Nocturnal hypoxia Continue supplemental oxygen 1 lpm at night. Goal >88-90%  Allergic rhinitis Advised to resume allergy regimen with non drowsy daily antihistamine and intranasal steroid.   Current smoker Quit approx 3 weeks ago. Counseled to remain smoke free. Continue  lung cancer screening LDCT chest annually. Due April 2026.   I discussed the assessment and treatment plan with the patient. The patient was provided an opportunity to ask questions and all were answered. The patient agreed with the plan and demonstrated an understanding of the instructions.   The patient was advised to call back or seek an in-person evaluation if the symptoms worsen or if the condition fails to improve as anticipated.  I provided 32 minutes of non-face-to-face time during this encounter.   Roetta Clarke, NP

## 2024-04-08 NOTE — Assessment & Plan Note (Signed)
 Quit approx 3 weeks ago. Counseled to remain smoke free. Continue lung cancer screening LDCT chest annually. Due April 2026.

## 2024-04-08 NOTE — Assessment & Plan Note (Signed)
 Multifactorial likely due to reflux, smoking history, postnasal drainage. Possible lisinopril is a factor as well. Advised her to discuss changing to alternative antihypertensive and reassess response.

## 2024-04-14 ENCOUNTER — Ambulatory Visit (INDEPENDENT_AMBULATORY_CARE_PROVIDER_SITE_OTHER): Admitting: Urology

## 2024-04-14 VITALS — BP 143/76 | HR 108 | Ht 60.0 in | Wt 133.3 lb

## 2024-04-14 DIAGNOSIS — N3281 Overactive bladder: Secondary | ICD-10-CM

## 2024-04-14 DIAGNOSIS — N3289 Other specified disorders of bladder: Secondary | ICD-10-CM

## 2024-04-14 DIAGNOSIS — N390 Urinary tract infection, site not specified: Secondary | ICD-10-CM

## 2024-04-14 MED ORDER — OXYBUTYNIN CHLORIDE ER 15 MG PO TB24: 15.0000 mg | ORAL_TABLET | Freq: Every day | ORAL | 11 refills | Status: AC

## 2024-04-14 NOTE — Progress Notes (Signed)
 04/14/24 1:56 PM   Emily Figueroa [birth year] [id number]  CC: Recurrent UTI, overactive bladder, possible bladder mass  HPI: 68 year old female reports a recurrent UTI over the last 3 years as well as urgency and frequency, as well as nocturia.  She was recently seen in the ER on 04/05/2024 and diagnosed with UTI with primary symptoms of abdominal pain and started on antibiotics, unfortunately this was not sent for culture.  She has had improvement in her abdominal pain with antibiotics.  Incidentally found on CT was a possible 1.3 cm mass at the dome of the bladder.  She drinks a fair amount of soda which worsens her OAB symptoms.  Per the chart she used to be on Vesicare , but she does not remember ever taking that medication.  She denies any gross hematuria or dysuria.  Urinalysis today relatively benign.  PMH: Past Medical History:  Diagnosis Date   Abdominal pain, generalized    Arthritis    Bell's palsy    Chicken pox    COPD (chronic obstructive pulmonary disease) (HCC)    Diarrhea    Dysphagia    GERD (gastroesophageal reflux disease)    Hypertension    Weight loss     Surgical History: Past Surgical History:  Procedure Laterality Date   COLONOSCOPY WITH PROPOFOL  N/A 09/14/2015   Procedure: COLONOSCOPY WITH PROPOFOL ;  Surgeon: Cassie Click, MD;  Location: [facility] ENDOSCOPY;  Service: Endoscopy;  Laterality: N/A;   DIAGNOSTIC LAPAROSCOPY     ESOPHAGOGASTRODUODENOSCOPY (EGD) WITH PROPOFOL  N/A 09/14/2015   Procedure: ESOPHAGOGASTRODUODENOSCOPY (EGD) WITH PROPOFOL ;  Surgeon: Cassie Click, MD;  Location: [facility] ENDOSCOPY;  Service: Endoscopy;  Laterality: N/A;   SAVORY DILATION N/A 09/14/2015   Procedure: SAVORY DILATION;  Surgeon: Cassie Click, MD;  Location: [facility] ENDOSCOPY;  Service: Endoscopy;  Laterality: N/A;   TEMPOROMANDIBULAR JOINT SURGERY      Family History: Family History  Problem Relation Age of Onset   Leukemia Mother    Heart Problems Mother      Social History:  reports that she quit smoking about 4 weeks ago. Her smoking use included cigarettes. She has a 47 pack-year smoking history. She has never used smokeless tobacco. She reports that she does not drink alcohol and does not use drugs.  Physical Exam: BP (!) 143/76 (BP Location: Left Arm, Patient Position: Sitting, Cuff Size: Normal)   Pulse (!) 108   Ht 5' (1.524 m)   Wt 133 lb 4.8 oz (60.5 kg)   BMI 26.03 kg/m    Constitutional:  Alert and oriented, No acute distress. Cardiovascular: No clubbing, cyanosis, or edema. Respiratory: Normal respiratory effort, no increased work of breathing. GI: Abdomen is soft, nontender, nondistended, no abdominal masses   Laboratory Data: Reviewed, see HPI  Pertinent Imaging: I have personally viewed and interpreted the CT scan dated 04/05/2024 showing a possible 1.3 centimeter bladder mass.  Assessment & Plan:   68 year old female with recurrent UTIs over the last few years, overactive symptoms of urgency, frequency, nocturia, as well as incidentally found possible 1 cm bladder mass on CT.  We discussed this could simply be inflammation from UTI or bladder wall thickening, but certainly warrants further investigation.  Risks and benefits of cystoscopy for both further evaluation discussed, and potential need for TURBT or other treatments in terms of her overactive symptoms I recommended a trial of oxybutynin .  In terms of her UTIs, can take cranberry tablets daily, could also consider topical estrogen cream in the future.  Follow-up for cystoscopy for further evaluation of possible bladder mass Trial of oxybutynin  10 mg XL daily for OAB Consider topical estrogen cream if recurrent infections   Jay Meth, MD 04/14/2024  [facility] [address] [place], [place] [address] [phone]

## 2024-04-14 NOTE — Patient Instructions (Signed)

## 2024-04-15 LAB — MICROSCOPIC EXAMINATION: Epithelial Cells (non renal): >10 /HPF — AB (ref 0–10)

## 2024-04-15 LAB — URINALYSIS, COMPLETE
Bilirubin, UA: NEGATIVE
Glucose, UA: NEGATIVE
Ketones, UA: NEGATIVE
Nitrite, UA: NEGATIVE
Protein,UA: NEGATIVE
RBC, UA: NEGATIVE
Specific Gravity, UA: <1.005 — ABNORMAL LOW (ref 1.005–1.030)
Urobilinogen, Ur: 0.2 mg/dL (ref 0.2–1.0)
pH, UA: 6 (ref 5.0–7.5)

## 2024-04-21 ENCOUNTER — Ambulatory Visit (INDEPENDENT_AMBULATORY_CARE_PROVIDER_SITE_OTHER): Admitting: Urology

## 2024-04-21 VITALS — BP 133/77 | HR 112

## 2024-04-21 DIAGNOSIS — N3289 Other specified disorders of bladder: Secondary | ICD-10-CM

## 2024-04-21 DIAGNOSIS — N390 Urinary tract infection, site not specified: Secondary | ICD-10-CM

## 2024-04-21 DIAGNOSIS — Z792 Long term (current) use of antibiotics: Secondary | ICD-10-CM

## 2024-04-21 MED ORDER — LIDOCAINE HCL URETHRAL/MUCOSAL 2 % EX GEL
1.0000 | Freq: Once | CUTANEOUS | Status: AC
  Administered 2024-04-21: 1 via URETHRAL

## 2024-04-21 MED ORDER — SULFAMETHOXAZOLE-TRIMETHOPRIM 800-160 MG PO TABS
1.0000 | ORAL_TABLET | Freq: Once | ORAL | Status: AC
  Administered 2024-04-21: 1 via ORAL

## 2024-04-21 NOTE — Progress Notes (Signed)
 Cystoscopy Procedure Note:  Indication: Bladder wall thickening  68 year old female recently treated for UTI, culture not sent, CT showed possible thickening at the dome of the bladder.  Bactrim given for prophylaxis  After informed consent and discussion of the procedure and its risks, Emily Figueroa was positioned and prepped in the standard fashion. Cystoscopy was performed with a flexible cystoscope. The urethra, bladder neck and entire bladder was visualized in a standard fashion.  No evidence of bladder tumors.  Mild erythema throughout the bladder likely secondary to recent infection. The ureteral orifices were visualized in their normal location and orientation.  No abnormalities on retroflexion  Findings: No definite tumors, mild bladder erythema likely from recent UTI  Assessment and Plan: -Cytology sent-would only biopsy if suspicious or positive.  If cytology negative, PA follow-up 6 months symptom check on oxybutynin  and consider topical estrogen cream -Continue oxybutynin  10 mg XL, she is already seen improvement in urgency/frequency -Consider topical estrogen cream in the future if recurrent infections  Jay Meth, MD 04/21/2024

## 2024-04-22 ENCOUNTER — Telehealth: Payer: Self-pay

## 2024-04-22 NOTE — Telephone Encounter (Signed)
 Called lab back confirmed we did want VU3 cytology. They will proceed with testing.

## 2024-04-22 NOTE — Telephone Encounter (Signed)
 [facility] called saying that they will need a new diagnosis for this pt's cytology. it was sent under hematuria but they said if it's under hematuria it will need a "orange kit"?? can we change it to bladder mass, since that code is already in the pt's chart? sorry, if this is confusing, because I'm confused. for better clarification you can call them at. [phone]

## 2024-05-04 ENCOUNTER — Telehealth: Payer: Self-pay

## 2024-05-04 NOTE — Telephone Encounter (Signed)
 On 5/15 received FMLA paperwork from Pts employer [employer]- Pt was seen by [employer] on 5/8 for cystoscopy only.  No surgery ordered.   Called HR at [employer] on 5/15 and 5/21 [facility] to get more information.   No return call.   Called pt and she states per PCP put her out of work since 4/19 and I can disregard FMLA paperwork.

## 2024-05-13 ENCOUNTER — Telehealth: Payer: Self-pay | Admitting: Urology

## 2024-05-13 NOTE — Telephone Encounter (Signed)
 Emily Figueroa returned call and stated FMLA paperwork does not need to be filled out.

## 2024-05-13 NOTE — Telephone Encounter (Signed)
 Error

## 2024-05-18 ENCOUNTER — Ambulatory Visit: Admitting: Anesthesiology

## 2024-05-18 ENCOUNTER — Encounter: Payer: Self-pay | Admitting: Internal Medicine

## 2024-05-18 ENCOUNTER — Encounter
Admission: RE | Disposition: A | Discharge status: Home / Self Care | Payer: Self-pay | Source: Home / Self Care | Attending: Internal Medicine

## 2024-05-18 ENCOUNTER — Other Ambulatory Visit: Payer: Self-pay

## 2024-05-18 ENCOUNTER — Ambulatory Visit
Admission: RE | Admit: 2024-05-18 | Discharge: 2024-05-18 | Disposition: A | Discharge status: Home / Self Care | Attending: Internal Medicine | Admitting: Internal Medicine

## 2024-05-18 DIAGNOSIS — Z9981 Dependence on supplemental oxygen: Secondary | ICD-10-CM | POA: Diagnosis not present

## 2024-05-18 DIAGNOSIS — Z8719 Personal history of other diseases of the digestive system: Secondary | ICD-10-CM | POA: Insufficient documentation

## 2024-05-18 DIAGNOSIS — K529 Noninfective gastroenteritis and colitis, unspecified: Secondary | ICD-10-CM | POA: Insufficient documentation

## 2024-05-18 DIAGNOSIS — K219 Gastro-esophageal reflux disease without esophagitis: Secondary | ICD-10-CM | POA: Insufficient documentation

## 2024-05-18 DIAGNOSIS — K640 First degree hemorrhoids: Secondary | ICD-10-CM | POA: Diagnosis not present

## 2024-05-18 DIAGNOSIS — K5730 Diverticulosis of large intestine without perforation or abscess without bleeding: Secondary | ICD-10-CM | POA: Diagnosis not present

## 2024-05-18 DIAGNOSIS — R1314 Dysphagia, pharyngoesophageal phase: Secondary | ICD-10-CM | POA: Diagnosis not present

## 2024-05-18 DIAGNOSIS — I10 Essential (primary) hypertension: Secondary | ICD-10-CM | POA: Insufficient documentation

## 2024-05-18 DIAGNOSIS — B3781 Candidal esophagitis: Secondary | ICD-10-CM | POA: Insufficient documentation

## 2024-05-18 DIAGNOSIS — J4489 Other specified chronic obstructive pulmonary disease: Secondary | ICD-10-CM | POA: Insufficient documentation

## 2024-05-18 DIAGNOSIS — R1313 Dysphagia, pharyngeal phase: Secondary | ICD-10-CM | POA: Diagnosis not present

## 2024-05-18 DIAGNOSIS — K2100 Gastro-esophageal reflux disease with esophagitis, without bleeding: Secondary | ICD-10-CM | POA: Diagnosis not present

## 2024-05-18 DIAGNOSIS — Z79899 Other long term (current) drug therapy: Secondary | ICD-10-CM | POA: Diagnosis not present

## 2024-05-18 DIAGNOSIS — Z7951 Long term (current) use of inhaled steroids: Secondary | ICD-10-CM | POA: Diagnosis not present

## 2024-05-18 HISTORY — PX: COLONOSCOPY: SHX5424

## 2024-05-18 HISTORY — PX: MALONEY DILATION: SHX5535

## 2024-05-18 HISTORY — PX: ESOPHAGOGASTRODUODENOSCOPY: SHX5428

## 2024-05-18 SURGERY — COLONOSCOPY
Anesthesia: General

## 2024-05-18 MED ORDER — SODIUM CHLORIDE 0.9 % IV SOLN: INTRAVENOUS | Status: DC

## 2024-05-18 MED ORDER — EPHEDRINE SULFATE-NACL 50-0.9 MG/10ML-% IV SOSY
PREFILLED_SYRINGE | INTRAVENOUS | Status: DC | PRN
  Administered 2024-05-18: 10 mg via INTRAVENOUS

## 2024-05-18 MED ORDER — PROPOFOL 10 MG/ML IV BOLUS
INTRAVENOUS | Status: DC | PRN
  Administered 2024-05-18: 50 mg via INTRAVENOUS

## 2024-05-18 MED ORDER — GLYCOPYRROLATE 0.2 MG/ML IJ SOLN
INTRAMUSCULAR | Status: AC
  Filled 2024-05-18: qty 1

## 2024-05-18 MED ORDER — GLYCOPYRROLATE 0.2 MG/ML IJ SOLN
INTRAMUSCULAR | Status: DC | PRN
  Administered 2024-05-18: .2 mg via INTRAVENOUS

## 2024-05-18 MED ORDER — PROPOFOL 500 MG/50ML IV EMUL
INTRAVENOUS | Status: DC | PRN
  Administered 2024-05-18: 75 ug/kg/min via INTRAVENOUS

## 2024-05-18 MED ORDER — DEXMEDETOMIDINE HCL IN NACL 80 MCG/20ML IV SOLN
INTRAVENOUS | Status: DC | PRN
  Administered 2024-05-18: 20 ug via INTRAVENOUS

## 2024-05-18 MED ORDER — LIDOCAINE HCL (CARDIAC) PF 100 MG/5ML IV SOSY
PREFILLED_SYRINGE | INTRAVENOUS | Status: DC | PRN
  Administered 2024-05-18: 60 mg via INTRAVENOUS

## 2024-05-18 MED ORDER — EPHEDRINE 5 MG/ML INJ
INTRAVENOUS | Status: AC
  Filled 2024-05-18: qty 10

## 2024-05-18 NOTE — H&P (Signed)
 Outpatient short stay form Pre-procedure 05/18/2024 1:13 PM Emily Figueroa K. Corky Diener, M.D.  Primary Physician: Kit Peri, PA  Reason for visit:  Dysphagia, lymphocytic colitis  History of present illness:  [name] is a pleasant 68 y/o patient with whom I am familiar from previous evaluation in 2022 for melena, diarrhea.  Prior GI workup by me included EGD and colonoscopy on 04/26/21 revealing H Pylori neg gastritis, normal duodenal biopsies on EGD and lymphocytic colitis (mild) on colonoscopy. Patient says that beginning in August 2024, she had exacerbation of GERD symptoms and was started on omeprazole 40 mg daily. This seemed to help. Patient also had issues with pharyngeal esophageal dysphagia type symptoms to both pills and solid food. Some of this was exacerbated by having worsened lung function where she would begin coughing while eating and then "almost choked to death". She does have intermittent episodes, however, of pills or food getting stuck when she is not short of breath and is "very slow to pass" beyond the suprasternal notch into the mid chest. Patient is also complained of some "black tarry stools" intermittently over the last few months. She denies any use of over-the-counter NSAIDs except for Tylenol. She has significant stomach sensitivity to taking aspirin  and other type of nonsteroidal anti-inflammatory medication. She does have lower abdominal cramping and some symptoms of fecal incontinence. Abdominal cramping on the right and left lower quadrants appear to improve with having a bowel movement. Patient denies any bright red blood per rectum. She does have a history of lymphocytic colitis on a previous colonoscopy performed by me in May 2022. This has not been treated as she has not followed up since that time. Patient was advised to take a baby aspirin  daily but she has not been doing this secondary to stomach upset.     Current Facility-Administered Medications:    0.9 %  sodium  chloride infusion, , Intravenous, Continuous, [place], Alphonsus Jeans, MD, Last Rate: 20 mL/hr at 05/18/24 1311, Continued from Pre-op at 05/18/24 1311  Facility-Administered Medications Ordered in Other Encounters:    glycopyrrolate  (ROBINUL ) injection, , Intravenous, Anesthesia Intra-op, Heriberto London, CRNA, 0.2 mg at 05/18/24 1300  Medications Prior to Admission  Medication Sig Dispense Refill Last Dose/Taking   albuterol  (VENTOLIN  HFA) 108 (90 Base) MCG/ACT inhaler Inhale 2 puffs into the lungs every 4 (four) hours as needed for wheezing or shortness of breath. 1 g 6 Past Week   ALLEGRA ALLERGY 180 MG tablet Take 180 mg by mouth daily.   05/17/2024   amLODipine (NORVASC) 10 MG tablet Take 10 mg by mouth daily.   05/17/2024   atorvastatin  (LIPITOR) 80 MG tablet Take 80 mg by mouth daily.   05/17/2024   CARAFATE 1 g tablet Take 1 g by mouth 4 (four) times daily.   05/17/2024   clotrimazole (MYCELEX) 10 MG troche Take by mouth.   05/17/2024   fluticasone  (FLONASE ) 50 MCG/ACT nasal spray Place 2 sprays into both nostrils daily. 18.2 mL 2 05/17/2024   fluticasone  (FLOVENT  HFA) 110 MCG/ACT inhaler Inhale into the lungs.   05/17/2024   fluticasone -salmeterol (ADVAIR HFA) 115-21 MCG/ACT inhaler Inhale 2 puffs into the lungs 2 (two) times daily. 1 each 12 05/17/2024   folic acid (FOLVITE) 1 MG tablet Take 1 mg by mouth daily.   05/17/2024   hydrochlorothiazide (HYDRODIURIL) 12.5 MG tablet Take 12.5 mg by mouth daily.   05/17/2024   ipratropium-albuterol  (DUONEB) 0.5-2.5 (3) MG/3ML SOLN    05/17/2024   latanoprost (XALATAN) 0.005 %  ophthalmic solution SMARTSIG:In Eye(s)   05/17/2024   lisinopril-hydrochlorothiazide (ZESTORETIC) 20-12.5 MG tablet    05/17/2024   montelukast (SINGULAIR) 10 MG tablet    05/17/2024   omeprazole (PRILOSEC) 20 MG capsule Take 20 mg by mouth daily.   05/17/2024   Omeprazole Magnesium 20.6 (20 Base) MG CPDR Take 1 capsule by mouth daily.   05/17/2024   ondansetron  (ZOFRAN ) 4 MG tablet Take 4 mg by mouth  every 8 (eight) hours as needed.   05/17/2024   ondansetron  (ZOFRAN -ODT) 8 MG disintegrating tablet Take 1 tablet (8 mg total) by mouth every 8 (eight) hours as needed for nausea or vomiting. 20 tablet 0 05/17/2024   oxybutynin  (DITROPAN  XL) 15 MG 24 hr tablet Take 1 tablet (15 mg total) by mouth daily. 30 tablet 11 05/17/2024   SPIRIVA  HANDIHALER 18 MCG inhalation capsule Place 18 mcg into inhaler and inhale daily.   05/18/2024 at 10:00 AM   cefUROXime  (CEFTIN ) 250 MG tablet Take 1 tablet (250 mg total) by mouth 2 (two) times daily with a meal. (Patient not taking: Reported on 05/18/2024) 14 tablet 0 Not Taking   clotrimazole (GYNE-LOTRIMIN) 1 % vaginal cream Place vaginally at bedtime.        Allergies  Allergen Reactions   Nitrofurantoin     Other Reaction(s): Not available   Symbicort [Budesonide-Formoterol Fumarate] Hives     Past Medical History:  Diagnosis Date   Abdominal pain, generalized    Arthritis    Bell's palsy    Chicken pox    COPD (chronic obstructive pulmonary disease) (HCC)    Diarrhea    Dysphagia    GERD (gastroesophageal reflux disease)    Hypertension    Weight loss     Review of systems:  Otherwise negative.    Physical Exam  Gen: Alert, oriented. Appears stated age.  HEENT: [place]/AT. PERRLA. Lungs: CTA, no wheezes. CV: RR nl S1, S2. Abd: soft, benign, no masses. BS+ Ext: No edema. Pulses 2+    Planned procedures: Proceed with EGD and colonoscopy. The patient understands the nature of the planned procedure, indications, risks, alternatives and potential complications including but not limited to bleeding, infection, perforation, damage to internal organs and possible oversedation/side effects from anesthesia. The patient agrees and gives consent to proceed.  Please refer to procedure notes for findings, recommendations and patient disposition/instructions.     Klark Vanderhoef K. Corky Diener, M.D. Gastroenterology 05/18/2024  1:13 PM

## 2024-05-18 NOTE — Interval H&P Note (Signed)
 History and Physical Interval Note:  05/18/2024 1:13 PM  Emily Figueroa  has presented today for surgery, with the diagnosis of Lymphocytic colitis (K52.832) Diarrhea, functional (K59.1) Gastroesophageal reflux disease without esophagitis (K21.9) Pharyngoesophageal dysphagia (R13.14).  The various methods of treatment have been discussed with the patient and family. After consideration of risks, benefits and other options for treatment, the patient has consented to  Procedure(s): COLONOSCOPY (N/A) EGD (ESOPHAGOGASTRODUODENOSCOPY) (N/A) as a surgical intervention.  The patient's history has been reviewed, patient examined, no change in status, stable for surgery.  I have reviewed the patient's chart and labs.  Questions were answered to the patient's satisfaction.     [place], Charle Mclaurin

## 2024-05-18 NOTE — Anesthesia Postprocedure Evaluation (Signed)
 Anesthesia Post Note  Patient: Emily Figueroa  Procedure(s) Performed: COLONOSCOPY EGD (ESOPHAGOGASTRODUODENOSCOPY) DILATION, ESOPHAGUS, USING MALONEY DILATOR  Patient location during evaluation: Endoscopy Anesthesia Type: General Level of consciousness: awake and alert Pain management: pain level controlled Vital Signs Assessment: post-procedure vital signs reviewed and stable Respiratory status: spontaneous breathing, nonlabored ventilation, respiratory function stable and patient connected to nasal cannula oxygen Cardiovascular status: blood pressure returned to baseline and stable Postop Assessment: no apparent nausea or vomiting Anesthetic complications: no   No notable events documented.   Last Vitals:  Vitals:   05/18/24 1300 05/18/24 1351  BP: 93/67 101/69  Pulse: 95 96  Resp: (!) 25 16  Temp:    SpO2: 99% 100%    Last Pain:  Vitals:   05/18/24 1351  TempSrc:   PainSc: 0-No pain                 Lattie Poli

## 2024-05-18 NOTE — Transfer of Care (Signed)
 Immediate Anesthesia Transfer of Care Note  Patient: Emily Figueroa  Procedure(s) Performed: COLONOSCOPY EGD (ESOPHAGOGASTRODUODENOSCOPY) DILATION, ESOPHAGUS, USING MALONEY DILATOR  Patient Location: PACU  Anesthesia Type:General  Level of Consciousness: sedated  Airway & Oxygen Therapy: Patient Spontanous Breathing  Post-op Assessment: Report given to RN and Post -op Vital signs reviewed and stable  Post vital signs: Reviewed and stable  Last Vitals:  Vitals Value Taken Time  BP    Temp    Pulse 95 05/18/24 1340  Resp 21 05/18/24 1340  SpO2 99 % 05/18/24 1340  Vitals shown include unfiled device data.  Last Pain:  Vitals:   05/18/24 1209  TempSrc: Temporal  PainSc: 0-No pain         Complications: No notable events documented.

## 2024-05-18 NOTE — Op Note (Addendum)
 [facility] Patient Name: Emily Figueroa Procedure Date: 05/18/2024 12:22 PM MRN: [id number] Account #: [id number] Date of Birth: 11-12-1956 Admit Type: Outpatient Age: 68 Room: [facility] ENDO ROOM 3 Gender: Female Note Status: Finalized Instrument Name: Upper Endoscope [id number] Procedure:             Upper GI endoscopy Indications:           Pharyngeal phase dysphagia, Esophageal dysphagia,                         Esophageal reflux Providers:             Shaquel Josephson K. Koran Seabrook MD, MD Medicines:             Propofol  per Anesthesia Complications:         No immediate complications. Estimated blood loss: None. Procedure:             Pre-Anesthesia Assessment:                        - The risks and benefits of the procedure and the                         sedation options and risks were discussed with the                         patient. All questions were answered and informed                         consent was obtained.                        - Patient identification and proposed procedure were                         verified prior to the procedure by the nurse. The                         procedure was verified in the procedure room.                        - ASA Grade Assessment: III - A patient with severe                         systemic disease.                        - After reviewing the risks and benefits, the patient                         was deemed in satisfactory condition to undergo the                         procedure.                        After obtaining informed consent, the endoscope was                         passed under direct vision. Throughout the procedure,  the patient's blood pressure, pulse, and oxygen                         saturations were monitored continuously. The Endoscope                         was introduced through the mouth, and advanced to the                         third part of duodenum. The  upper GI endoscopy was                         accomplished without difficulty. The patient tolerated                         the procedure well. Findings:      Emily Figueroa was found diffusely, throughout the larynx. Estimated blood loss:       none.      LA Grade A (one or more mucosal breaks less than 5 mm, not extending       between tops of 2 mucosal folds) esophagitis with no bleeding was found       in the distal esophagus.      No endoscopic abnormality was evident in the esophagus to explain the       patient's complaint of dysphagia. It was decided, however, to proceed       with dilation in the distal esophagus. The scope was withdrawn. Dilation       was performed with a Maloney dilator with no resistance at 54 Fr.      The stomach was normal.      The examined duodenum was normal.      No other significant abnormalities were identified in a careful       examination of the esophagus. Impression:            - Emily Figueroa was visualized diffusely, throughout the                         larynx.                        - LA Grade A reflux esophagitis with no bleeding.                        - No endoscopic esophageal abnormality to explain                         patient's dysphagia. Esophagus dilated. Dilated.                        - Normal stomach.                        - Normal examined duodenum.                        - No specimens collected. Recommendation:        - Monitor results to esophageal dilation                        - Mycelex (clotrimazole) 10 mg lozenge 5x/day  for 2                         weeks.                        - Proceed with colonoscopy Procedure Code(s):     --- Professional ---                        [phone], Esophagogastroduodenoscopy, flexible,                         transoral; diagnostic, including collection of                         specimen(s) by brushing or washing, when performed                         (separate procedure)                         [phone], Dilation of esophagus, by unguided sound or                         bougie, single or multiple passes Diagnosis Code(s):     --- Professional ---                        R13.14, Dysphagia, pharyngoesophageal phase                        R13.13, Dysphagia, pharyngeal phase                        K21.00, Gastro-esophageal reflux disease with                         esophagitis, without bleeding                        B37.0, Candidal stomatitis CPT copyright 2022 [employer]. All rights reserved. The codes documented in this report are preliminary and upon coder review may  be revised to meet current compliance requirements. Cassie Click MD, MD 05/18/2024 1:26:08 PM This report has been signed electronically. Number of Addenda: 0 Note Initiated On: 05/18/2024 12:22 PM Estimated Blood Loss:  Estimated blood loss: none.      [facility]

## 2024-05-18 NOTE — Anesthesia Preprocedure Evaluation (Signed)
 Anesthesia Evaluation  Patient identified by MRN, date of birth, ID band Patient awake    Reviewed: Allergy & Precautions, NPO status , Patient's Chart, lab work & pertinent test results  History of Anesthesia Complications Negative for: history of anesthetic complications  Airway Mallampati: II  TM Distance: >3 FB Neck ROM: Full    Dental  (+) Upper Dentures, Chipped   Pulmonary sleep apnea , COPD,  COPD inhaler and oxygen dependent, Current Smoker and Patient abstained from smoking. Nighttime O2    + decreased breath sounds      Cardiovascular Exercise Tolerance: Good METShypertension, Pt. on medications (-) CAD and (-) Past MI (-) dysrhythmias  Rhythm:Regular Rate:Normal - Systolic murmurs    Neuro/Psych negative neurological ROS  negative psych ROS   GI/Hepatic ,GERD  ,,(+)     (-) substance abuse    Endo/Other  neg diabetes    Renal/GU negative Renal ROS     Musculoskeletal   Abdominal   Peds  Hematology   Anesthesia Other Findings Past Medical History: No date: Abdominal pain, generalized No date: Arthritis No date: Bell's palsy No date: Chicken pox No date: COPD (chronic obstructive pulmonary disease) (HCC) No date: Diarrhea No date: Dysphagia No date: GERD (gastroesophageal reflux disease) No date: Hypertension No date: Weight loss  Reproductive/Obstetrics                             Anesthesia Physical Anesthesia Plan  ASA: 3  Anesthesia Plan: General   Post-op Pain Management: Minimal or no pain anticipated   Induction: Intravenous  PONV Risk Score and Plan: 2 and Propofol  infusion, TIVA and Ondansetron   Airway Management Planned: Nasal Cannula  Additional Equipment: None  Intra-op Plan:   Post-operative Plan:   Informed Consent: I have reviewed the patients History and Physical, chart, labs and discussed the procedure including the risks, benefits and  alternatives for the proposed anesthesia with the patient or authorized representative who has indicated his/her understanding and acceptance.     Dental advisory given  Plan Discussed with: CRNA and Surgeon  Anesthesia Plan Comments: (Discussed risks of anesthesia with patient, including possibility of difficulty with spontaneous ventilation under anesthesia necessitating airway intervention, PONV, and rare risks such as cardiac or respiratory or neurological events, and allergic reactions. Discussed the role of CRNA in patient's perioperative care. Patient understands. Patient counseled on benefits of smoking cessation, and increased perioperative risks associated with continued smoking. )       Anesthesia Quick Evaluation

## 2024-05-18 NOTE — Op Note (Signed)
 [facility] Patient Name: Emily Figueroa Procedure Date: 05/18/2024 12:21 PM MRN: [id number] Account #: [id number] Date of Birth: 11-21-1956 Admit Type: Outpatient Age: 68 Room: [facility] ENDO ROOM 3 Gender: Female Note Status: Finalized Instrument Name: Hyman Main [id number] Procedure:             Colonoscopy Indications:           Chronic diarrhea, History of lymphocytic colitis Providers:             Exie Chrismer K. Galina Haddox MD, MD Medicines:             Propofol  per Anesthesia Complications:         No immediate complications. Estimated blood loss:                         Minimal. Procedure:             Pre-Anesthesia Assessment:                        - The risks and benefits of the procedure and the                         sedation options and risks were discussed with the                         patient. All questions were answered and informed                         consent was obtained.                        - Patient identification and proposed procedure were                         verified prior to the procedure by the nurse. The                         procedure was verified in the procedure room.                        - ASA Grade Assessment: III - A patient with severe                         systemic disease.                        - After reviewing the risks and benefits, the patient                         was deemed in satisfactory condition to undergo the                         procedure.                        After obtaining informed consent, the colonoscope was                         passed under direct vision. Throughout the procedure,  the patient's blood pressure, pulse, and oxygen                         saturations were monitored continuously. The                         Colonoscope was introduced through the anus and                         advanced to the the terminal ileum, with                          identification of the appendiceal orifice and IC                         valve. The colonoscopy was performed without                         difficulty. The patient tolerated the procedure well.                         The quality of the bowel preparation was good. The                         terminal ileum, ileocecal valve, appendiceal orifice,                         and rectum were photographed. Findings:      The perianal and digital rectal examinations were normal. Pertinent       negatives include normal sphincter tone and no palpable rectal lesions.      Non-bleeding internal hemorrhoids were found during retroflexion. The       hemorrhoids were Grade I (internal hemorrhoids that do not prolapse).      Many medium-mouthed and small-mouthed diverticula were found in the       sigmoid colon.      Normal mucosa was found in the entire colon. Biopsies for histology were       taken with a cold forceps from the random colon for evaluation of       microscopic colitis. Verification of patient identification for the       specimen was done by the nurse using the patient's birth date. Estimated       blood loss was minimal.      The exam was otherwise without abnormality.      The terminal ileum appeared normal. Impression:            - Non-bleeding internal hemorrhoids.                        - Diverticulosis in the sigmoid colon.                        - Normal mucosa in the entire examined colon. Biopsied.                        - The examination was otherwise normal.                        - The examined portion of the ileum was normal.  Recommendation:        - Monitor results to esophageal dilation                        - Mycelex (clotrimazole) 10 mg lozenge 5x/day.                        - Resume previous diet.                        - Continue present medications.                        - Await pathology results.                        - Repeat colonoscopy in 10 years for  screening                         purposes.                        - Return to my office in 3 months.                        - Telephone GI office to schedule appointment.                        - The findings and recommendations were discussed with                         the patient. Procedure Code(s):     --- Professional ---                        [phone], Colonoscopy, flexible; with biopsy, single or                         multiple Diagnosis Code(s):     --- Professional ---                        K57.30, Diverticulosis of large intestine without                         perforation or abscess without bleeding                        K52.9, Noninfective gastroenteritis and colitis,                         unspecified                        K64.0, First degree hemorrhoids CPT copyright 2022 [employer]. All rights reserved. The codes documented in this report are preliminary and upon coder review may  be revised to meet current compliance requirements. Cassie Click MD, MD 05/18/2024 1:41:10 PM This report has been signed electronically. Number of Addenda: 0 Note Initiated On: 05/18/2024 12:21 PM Scope Withdrawal Time: 0 hours 5 minutes 55 seconds  Total Procedure Duration: 0 hours 8 minutes 31 seconds  Estimated Blood Loss:  Estimated blood loss was minimal. Estimated blood loss  was minimal.      [facility]

## 2024-05-19 LAB — SURGICAL PATHOLOGY

## 2024-09-16 ENCOUNTER — Other Ambulatory Visit: Payer: Self-pay | Admitting: Gastroenterology

## 2024-09-16 ENCOUNTER — Ambulatory Visit
Admission: RE | Admit: 2024-09-16 | Discharge: 2024-09-16 | Disposition: A | Discharge status: Home / Self Care | Source: Ambulatory Visit | Attending: Gastroenterology | Admitting: Gastroenterology

## 2024-09-16 DIAGNOSIS — M25561 Pain in right knee: Secondary | ICD-10-CM | POA: Diagnosis present

## 2024-09-26 ENCOUNTER — Other Ambulatory Visit: Payer: Self-pay | Admitting: Nurse Practitioner

## 2024-09-26 DIAGNOSIS — J302 Other seasonal allergic rhinitis: Secondary | ICD-10-CM

## 2024-09-27 ENCOUNTER — Other Ambulatory Visit: Payer: Self-pay | Admitting: Nurse Practitioner

## 2024-09-27 DIAGNOSIS — J302 Other seasonal allergic rhinitis: Secondary | ICD-10-CM

## 2024-11-09 ENCOUNTER — Ambulatory Visit: Admitting: Physician Assistant

## 2024-11-14 ENCOUNTER — Other Ambulatory Visit: Payer: Self-pay | Admitting: Nurse Practitioner

## 2024-11-14 DIAGNOSIS — J302 Other seasonal allergic rhinitis: Secondary | ICD-10-CM

## 2025-03-07 ENCOUNTER — Ambulatory Visit
